# Patient Record
Sex: Female | Born: 1970
Health system: Southern US, Community
[De-identification: ages and names within clinical notes are randomized; demographics above are authoritative.]

## PROBLEM LIST (undated history)

## (undated) ENCOUNTER — Emergency Department (HOSPITAL_COMMUNITY): Admission: EM | Payer: BLUE CROSS/BLUE SHIELD | Source: Home / Self Care

## (undated) DIAGNOSIS — R51 Headache: Secondary | ICD-10-CM

## (undated) DIAGNOSIS — L959 Vasculitis limited to the skin, unspecified: Secondary | ICD-10-CM

## (undated) DIAGNOSIS — I7300 Raynaud's syndrome without gangrene: Secondary | ICD-10-CM

## (undated) DIAGNOSIS — F1190 Opioid use, unspecified, uncomplicated: Secondary | ICD-10-CM

## (undated) DIAGNOSIS — M542 Cervicalgia: Secondary | ICD-10-CM

## (undated) DIAGNOSIS — R000 Tachycardia, unspecified: Secondary | ICD-10-CM

## (undated) DIAGNOSIS — I776 Arteritis, unspecified: Secondary | ICD-10-CM

## (undated) DIAGNOSIS — IMO0002 Reserved for concepts with insufficient information to code with codable children: Secondary | ICD-10-CM

## (undated) DIAGNOSIS — G709 Myoneural disorder, unspecified: Secondary | ICD-10-CM

## (undated) DIAGNOSIS — R42 Dizziness and giddiness: Secondary | ICD-10-CM

## (undated) DIAGNOSIS — K219 Gastro-esophageal reflux disease without esophagitis: Secondary | ICD-10-CM

## (undated) DIAGNOSIS — D649 Anemia, unspecified: Secondary | ICD-10-CM

## (undated) DIAGNOSIS — R519 Headache, unspecified: Secondary | ICD-10-CM

## (undated) DIAGNOSIS — I10 Essential (primary) hypertension: Secondary | ICD-10-CM

## (undated) DIAGNOSIS — K635 Polyp of colon: Secondary | ICD-10-CM

## (undated) DIAGNOSIS — E049 Nontoxic goiter, unspecified: Secondary | ICD-10-CM

## (undated) DIAGNOSIS — M898X1 Other specified disorders of bone, shoulder: Secondary | ICD-10-CM

## (undated) DIAGNOSIS — N809 Endometriosis, unspecified: Secondary | ICD-10-CM

## (undated) DIAGNOSIS — M79602 Pain in left arm: Secondary | ICD-10-CM

## (undated) DIAGNOSIS — G4730 Sleep apnea, unspecified: Secondary | ICD-10-CM

## (undated) DIAGNOSIS — N3010 Interstitial cystitis (chronic) without hematuria: Secondary | ICD-10-CM

## (undated) DIAGNOSIS — G8929 Other chronic pain: Secondary | ICD-10-CM

## (undated) DIAGNOSIS — M25562 Pain in left knee: Secondary | ICD-10-CM

## (undated) DIAGNOSIS — L88 Pyoderma gangrenosum: Secondary | ICD-10-CM

## (undated) DIAGNOSIS — G540 Brachial plexus disorders: Secondary | ICD-10-CM

## (undated) DIAGNOSIS — M1990 Unspecified osteoarthritis, unspecified site: Secondary | ICD-10-CM

## (undated) DIAGNOSIS — M25561 Pain in right knee: Secondary | ICD-10-CM

## (undated) DIAGNOSIS — M797 Fibromyalgia: Secondary | ICD-10-CM

## (undated) HISTORY — DX: Anemia, unspecified: D64.9

## (undated) HISTORY — DX: Nontoxic goiter, unspecified: E04.9

## (undated) HISTORY — DX: Gastro-esophageal reflux disease without esophagitis: K21.9

## (undated) HISTORY — DX: Tachycardia, unspecified: R00.0

## (undated) HISTORY — PX: OTHER SURGICAL HISTORY: SHX169

## (undated) HISTORY — DX: Reserved for concepts with insufficient information to code with codable children: IMO0002

## (undated) HISTORY — DX: Sleep apnea, unspecified: G47.30

## (undated) HISTORY — DX: Polyp of colon: K63.5

## (undated) HISTORY — DX: Brachial plexus disorders: G54.0

## (undated) HISTORY — PX: ABDOMINAL HYSTERECTOMY: SHX81

## (undated) HISTORY — DX: Essential (primary) hypertension: I10

## (undated) HISTORY — DX: Endometriosis, unspecified: N80.9

## (undated) HISTORY — DX: Unspecified osteoarthritis, unspecified site: M19.90

## (undated) HISTORY — DX: Vasculitis limited to the skin, unspecified: L95.9

## (undated) HISTORY — PX: OVARIAN CYST REMOVAL: SHX89

## (undated) HISTORY — DX: Interstitial cystitis (chronic) without hematuria: N30.10

## (undated) HISTORY — DX: Opioid use, unspecified, uncomplicated: F11.90

## (undated) HISTORY — DX: Arteritis, unspecified: I77.6

## (undated) HISTORY — DX: Myoneural disorder, unspecified: G70.9

## (undated) HISTORY — DX: Fibromyalgia: M79.7

---

## 1997-06-24 ENCOUNTER — Other Ambulatory Visit: Admission: RE | Admit: 1997-06-24 | Discharge: 1997-06-24 | Payer: Self-pay | Admitting: Gynecology

## 1998-11-15 ENCOUNTER — Other Ambulatory Visit: Admission: RE | Admit: 1998-11-15 | Discharge: 1998-11-15 | Payer: Self-pay | Admitting: Gynecology

## 2000-07-26 ENCOUNTER — Ambulatory Visit (HOSPITAL_BASED_OUTPATIENT_CLINIC_OR_DEPARTMENT_OTHER): Admission: RE | Admit: 2000-07-26 | Discharge: 2000-07-26 | Payer: Self-pay | Admitting: *Deleted

## 2000-07-26 ENCOUNTER — Encounter (INDEPENDENT_AMBULATORY_CARE_PROVIDER_SITE_OTHER): Payer: Self-pay | Admitting: Specialist

## 2000-09-13 ENCOUNTER — Encounter: Admission: RE | Admit: 2000-09-13 | Discharge: 2000-09-13 | Payer: Self-pay | Admitting: *Deleted

## 2000-11-21 ENCOUNTER — Other Ambulatory Visit: Admission: RE | Admit: 2000-11-21 | Discharge: 2000-11-21 | Payer: Self-pay | Admitting: Gynecology

## 2002-01-29 DIAGNOSIS — G540 Brachial plexus disorders: Secondary | ICD-10-CM

## 2002-01-29 HISTORY — DX: Brachial plexus disorders: G54.0

## 2002-06-06 ENCOUNTER — Emergency Department (HOSPITAL_COMMUNITY): Admission: EM | Admit: 2002-06-06 | Discharge: 2002-06-06 | Payer: Self-pay | Admitting: Emergency Medicine

## 2002-06-09 ENCOUNTER — Other Ambulatory Visit: Admission: RE | Admit: 2002-06-09 | Discharge: 2002-06-09 | Payer: Self-pay | Admitting: Gynecology

## 2003-07-20 ENCOUNTER — Emergency Department (HOSPITAL_COMMUNITY): Admission: EM | Admit: 2003-07-20 | Discharge: 2003-07-21 | Payer: Self-pay | Admitting: Emergency Medicine

## 2003-07-25 ENCOUNTER — Ambulatory Visit (HOSPITAL_COMMUNITY): Admission: AD | Admit: 2003-07-25 | Discharge: 2003-07-25 | Payer: Self-pay | Admitting: Gynecology

## 2003-07-25 ENCOUNTER — Encounter (INDEPENDENT_AMBULATORY_CARE_PROVIDER_SITE_OTHER): Payer: Self-pay | Admitting: Specialist

## 2003-07-26 ENCOUNTER — Encounter (INDEPENDENT_AMBULATORY_CARE_PROVIDER_SITE_OTHER): Payer: Self-pay | Admitting: Specialist

## 2003-11-19 ENCOUNTER — Other Ambulatory Visit: Admission: RE | Admit: 2003-11-19 | Discharge: 2003-11-19 | Payer: Self-pay | Admitting: Gynecology

## 2003-12-01 ENCOUNTER — Encounter: Admission: RE | Admit: 2003-12-01 | Discharge: 2003-12-01 | Payer: Self-pay | Admitting: Gastroenterology

## 2004-04-06 ENCOUNTER — Ambulatory Visit (HOSPITAL_COMMUNITY): Admission: RE | Admit: 2004-04-06 | Discharge: 2004-04-06 | Payer: Self-pay | Admitting: Gastroenterology

## 2004-04-06 ENCOUNTER — Encounter (INDEPENDENT_AMBULATORY_CARE_PROVIDER_SITE_OTHER): Payer: Self-pay | Admitting: *Deleted

## 2004-04-30 ENCOUNTER — Emergency Department (HOSPITAL_COMMUNITY): Admission: EM | Admit: 2004-04-30 | Discharge: 2004-04-30 | Payer: Self-pay | Admitting: Emergency Medicine

## 2004-05-12 ENCOUNTER — Emergency Department (HOSPITAL_COMMUNITY): Admission: EM | Admit: 2004-05-12 | Discharge: 2004-05-12 | Payer: Self-pay | Admitting: Family Medicine

## 2004-06-28 ENCOUNTER — Encounter: Admission: RE | Admit: 2004-06-28 | Discharge: 2004-06-28 | Payer: Self-pay | Admitting: Thoracic Surgery

## 2005-01-29 HISTORY — PX: PELVIC LAPAROSCOPY: SHX162

## 2006-06-27 ENCOUNTER — Emergency Department (HOSPITAL_COMMUNITY): Admission: EM | Admit: 2006-06-27 | Discharge: 2006-06-27 | Payer: Self-pay | Admitting: Emergency Medicine

## 2006-10-24 ENCOUNTER — Encounter (INDEPENDENT_AMBULATORY_CARE_PROVIDER_SITE_OTHER): Payer: Self-pay | Admitting: Urology

## 2006-10-24 ENCOUNTER — Ambulatory Visit (HOSPITAL_BASED_OUTPATIENT_CLINIC_OR_DEPARTMENT_OTHER): Admission: RE | Admit: 2006-10-24 | Discharge: 2006-10-24 | Payer: Self-pay | Admitting: Urology

## 2007-02-24 ENCOUNTER — Ambulatory Visit: Payer: Self-pay | Admitting: Gastroenterology

## 2007-02-28 ENCOUNTER — Ambulatory Visit: Payer: Self-pay | Admitting: Cardiology

## 2007-03-03 ENCOUNTER — Ambulatory Visit: Payer: Self-pay | Admitting: Gastroenterology

## 2007-03-19 ENCOUNTER — Emergency Department (HOSPITAL_COMMUNITY): Admission: EM | Admit: 2007-03-19 | Discharge: 2007-03-20 | Payer: Self-pay | Admitting: Emergency Medicine

## 2007-03-25 ENCOUNTER — Other Ambulatory Visit: Admission: RE | Admit: 2007-03-25 | Discharge: 2007-03-25 | Payer: Self-pay | Admitting: Gynecology

## 2007-04-15 ENCOUNTER — Ambulatory Visit: Payer: Self-pay | Admitting: Gastroenterology

## 2007-08-27 ENCOUNTER — Ambulatory Visit (HOSPITAL_BASED_OUTPATIENT_CLINIC_OR_DEPARTMENT_OTHER): Admission: RE | Admit: 2007-08-27 | Discharge: 2007-08-27 | Payer: Self-pay | Admitting: Family Medicine

## 2007-08-30 ENCOUNTER — Ambulatory Visit: Payer: Self-pay | Admitting: Internal Medicine

## 2007-12-16 ENCOUNTER — Ambulatory Visit: Payer: Self-pay | Admitting: Gynecology

## 2008-09-15 ENCOUNTER — Other Ambulatory Visit: Admission: RE | Admit: 2008-09-15 | Discharge: 2008-09-15 | Payer: Self-pay | Admitting: Gynecology

## 2008-09-15 ENCOUNTER — Encounter: Payer: Self-pay | Admitting: Gynecology

## 2008-09-15 ENCOUNTER — Ambulatory Visit: Payer: Self-pay | Admitting: Gynecology

## 2009-04-07 ENCOUNTER — Emergency Department (HOSPITAL_COMMUNITY): Admission: EM | Admit: 2009-04-07 | Discharge: 2009-04-07 | Payer: Self-pay | Admitting: Emergency Medicine

## 2009-04-12 ENCOUNTER — Ambulatory Visit: Payer: Self-pay | Admitting: Gastroenterology

## 2009-04-12 ENCOUNTER — Emergency Department (HOSPITAL_COMMUNITY): Admission: EM | Admit: 2009-04-12 | Discharge: 2009-04-12 | Payer: Self-pay | Admitting: Emergency Medicine

## 2009-11-24 ENCOUNTER — Ambulatory Visit: Payer: Self-pay | Admitting: Gynecology

## 2010-02-19 ENCOUNTER — Encounter: Payer: Self-pay | Admitting: Gastroenterology

## 2010-02-28 NOTE — Assessment & Plan Note (Signed)
Review of gastrointestinal problems: 1. Nonulcer dyspepsia: EGD 2009 was normal. I suspected much of her symptoms were from the several NSAIDs that she was taking daily.  GERD may contribute    History of Present Illness Visit Type: Initial Consult Primary GI MD: Rob Bunting MD Primary Provider: Dewain Penning, MD Requesting Provider: Dewain Penning, MD Chief Complaint: Abdominal pain , fatique History of Present Illness:     who was last seen abouty 2 years ago.  She has been very fatigued lately,  drinking alot of Red Bull, coffee.   Recently found to have anemia, Hb 10.5 (low normal MCV).   she has not stop taking the NSAIDs like I recommended. She has chronic intermittent right lower quadrant pains and 4 days ago the pains became much more acute Called her PCP, gynecologist and was told to go to ER for possible appendicitis.  She was seen by a surgeon,  who was prepping her for surgery, however CT was done and suggested no appy and so she did not want to proceed even though surgeon said she still should.  she was given a prescription for Augmentin but stopped taking those pills after only 2 days. She has been having some low-grade fevers.  she has been eating and having normal bowel movements without diarrhea. She has had no overt GI bleeding.  About 2 weeks ago, ran out of nexium.  GERD symptoms became MUCH worse.  she is nearly panic since looking through the Internet last night and reading a lot about anemia and its associated with colon cancer. She is certain that she probably has a colon cancer. She did have a colonoscopy Dr. Leary Roca in 2006 and is found a small tubular adenoma and so she would be due for colonoscopy are round now.           Current Medications (verified): 1)  Nexium 40 Mg Cpdr (Esomeprazole Magnesium) .Marland Kitchen.. 1 By Mouth Once Daily 2)  Advil 200 Mg Tabs (Ibuprofen) .... As Needed 3)  Diazepam 5 Mg Tabs (Diazepam) .... Take 1/2 Tablet At Bedtime 4)  Percocet  5-325 Mg Tabs (Oxycodone-Acetaminophen) .... As Needed  Allergies (verified): 1)  ! Tetracycline 2)  ! Doxycycline  Past History:  Past Medical History: Tubular adenomas ([facility] 2006) thoracic outlet syndrome GERD  Interstitial Cystitis Ovarian cyst   Past Surgical History: Hemorrhoidectomy ovary removed   Family History: no colon cancer  Social History: does not smoke, rare alcohol  Review of Systems       Pertinent positive and negative review of systems were noted in the above HPI and GI specific review of systems.  All other review of systems was otherwise negative.   Vital Signs:  Patient profile:   40 year old female Height:      66 inches Weight:      134.38 pounds BMI:     21.77 Temp:     98.4 degrees F oral Pulse rate:   100 / minute Pulse rhythm:   regular BP sitting:   116 / 66  (left arm) Cuff size:   regular  Vitals Entered By: June McMurray CMA Duncan Dull) (April 12, 2009 1:41 PM)  Physical Exam  Additional Exam:  Constitutional: generally well appearing Psychiatric: alert and oriented times 3 Abdomen: soft, Very tender in the right lower quadrant, + rovsing sign when pressing on left abdomen non-distended, normal bowel sounds    Impression & Recommendations:  Problem # 1:  subacute right lower quadrant pain I did  review the CT scan that she had clearly her appendix looked normal however she is still very tender in the right lower quadrant. She has had intermittent right lower quadrant pains but never nearly as severe as this.  I spoke with a surgeon from [facility] [place] [facility] and we are arranging for the patient to go to [facility] long emergency room for a CBC and surgical reevaluation in the emergency room.  Patient Instructions: 1)  Go to the [facility] long emergency room for repeat surgical evaluation, repeat labs 2)  Restart your antibiotics for now. 3)  A copy of this information will be sent to Dr. Collins Scotland. 4)  Return to see Dr. Christella Hartigan in 1-2  months. 5)  The medication list was reviewed and reconciled.  All changed / newly prescribed medications were explained.  A complete medication list was provided to the patient / caregiver.

## 2010-02-28 NOTE — Procedures (Signed)
Summary: EGD   EGD  Procedure date:  03/03/2007  Findings:      [facility]   Patient Name: [name], [name] MRN:  Procedure Procedures: Panendoscopy (EGD) CPT: 43235.  Personnel: Endoscopist: Rachael Fee, MD.  Referred By: Herb Grays, MD.  Exam Location: Exam performed in Endoscopy Suite. Outpatient  Patient Consent: Procedure, Alternatives, Risks and Benefits discussed, consent obtained, from patient. Consent was obtained by the RN.  Indications Symptoms: Abdominal pain, Reflux symptoms  History  Current Medications: Patient is not currently taking Coumadin.  Comments: Patient history reviewed and updated, pre-procedure physical performed prior to initiation of sedation? yes Pre-Exam Physical: Performed Mar 03, 2007  Cardio-pulmonary exam, Abdominal exam, Mental status exam WNL.  Exam Exam Info: Maximum depth of insertion Duodenum, intended Duodenum. Patient position: on left side. Gastric retroflexion performed. Images taken. ASA Classification: II. Tolerance: good.  Sedation Meds: Patient assessed and found to be appropriate for moderate (conscious) sedation. Fentanyl 75 mcg. given IV. Versed 7 mg. given IV. benedryl 25 given IV.  Monitoring: BP and pulse monitoring done. Oximetry used. Supplemental O2 given  Findings - Normal: Proximal Esophagus to Duodenal 2nd Portion.   Assessment Normal examination.  Comments: DAILY NSAIDS (IBUPROFEN) IS LIKELY CONTRIBUTING TO NON-ULCER DYSPEPSIA.  SHE SHOULD LIMIT THOSE AS MUCH AS POSSIBLE.  SHE WILL ALSO INCREASE NEXIUM BACK TO TWICE DAILY, BEST TAKEN 20-30 MIN PRIOR TO BREAKFAST AND DINNER MEALS.  LASTLY THE CT SCAN WITH IV AND PO CONTRAST LAST WEEK WAS ESSENTIALLY NORMAL EXCEPT FOR OVARIAN CYSTS AND ? OF CONGESTION.  I WILL ARRANGE FOR PELVIC ULTRASOUND IN 6 WEEKS (AS WAS RECOMMENDED BY RADIOLOGY).  SHE WILL F/U WITH ME IN OFFICE 1-2 WEEKS AFTERWARDS. Events  Unplanned  Intervention: No unplanned interventions were required.  Unplanned Events: There were no complications. Plans Comments: PELVIC (TRANSVAGINAL) ULTRASOUND IN 5-6 WEEKS. ROV WITH ME IN 6-7 WEEKS. RESUME NEXIUM TWICE DAILY BUT TAKEN AT THE CORRECT TIME IN RELATION TO MEALS. This report was created from the original endoscopy report, which was reviewed and signed by the above listed endoscopist.

## 2010-04-19 ENCOUNTER — Ambulatory Visit (INDEPENDENT_AMBULATORY_CARE_PROVIDER_SITE_OTHER): Payer: 59 | Admitting: Gynecology

## 2010-04-19 ENCOUNTER — Other Ambulatory Visit: Payer: 59

## 2010-04-19 DIAGNOSIS — N800 Endometriosis of uterus: Secondary | ICD-10-CM

## 2010-04-19 DIAGNOSIS — N8300 Follicular cyst of ovary, unspecified side: Secondary | ICD-10-CM

## 2010-04-19 DIAGNOSIS — N920 Excessive and frequent menstruation with regular cycle: Secondary | ICD-10-CM

## 2010-04-23 LAB — POCT I-STAT, CHEM 8
BUN: 10 mg/dL (ref 6–23)
Calcium, Ion: 1.2 mmol/L (ref 1.12–1.32)
Creatinine, Ser: 0.6 mg/dL (ref 0.4–1.2)

## 2010-04-23 LAB — CBC
MCHC: 32 g/dL (ref 30.0–36.0)
MCV: 85.2 fL (ref 78.0–100.0)
MCV: 86.6 fL (ref 78.0–100.0)
Platelets: 317 10*3/uL (ref 150–400)
RBC: 4.09 MIL/uL (ref 3.87–5.11)
RBC: 4.16 MIL/uL (ref 3.87–5.11)
RDW: 15.1 % (ref 11.5–15.5)
RDW: 15.3 % (ref 11.5–15.5)
WBC: 7.4 10*3/uL (ref 4.0–10.5)
WBC: 9 10*3/uL (ref 4.0–10.5)

## 2010-04-23 LAB — PREGNANCY, URINE: Preg Test, Ur: NEGATIVE

## 2010-04-23 LAB — URINALYSIS, ROUTINE W REFLEX MICROSCOPIC
Bilirubin Urine: NEGATIVE
Bilirubin Urine: NEGATIVE
Hgb urine dipstick: NEGATIVE
Ketones, ur: NEGATIVE mg/dL
Leukocytes, UA: NEGATIVE
Nitrite: NEGATIVE

## 2010-04-23 LAB — URINE MICROSCOPIC-ADD ON

## 2010-04-23 LAB — BASIC METABOLIC PANEL
Creatinine, Ser: 0.6 mg/dL (ref 0.4–1.2)
Glucose, Bld: 95 mg/dL (ref 70–99)
Sodium: 136 mEq/L (ref 135–145)

## 2010-04-23 LAB — PROTIME-INR: INR: 0.99 (ref 0.00–1.49)

## 2010-04-23 LAB — DIFFERENTIAL
Basophils Absolute: 0 10*3/uL (ref 0.0–0.1)
Basophils Absolute: 0 10*3/uL (ref 0.0–0.1)
Basophils Relative: 0 % (ref 0–1)
Eosinophils Absolute: 0.1 10*3/uL (ref 0.0–0.7)
Eosinophils Relative: 0 % (ref 0–5)
Eosinophils Relative: 1 % (ref 0–5)
Monocytes Absolute: 0.5 10*3/uL (ref 0.1–1.0)
Neutro Abs: 7.2 10*3/uL (ref 1.7–7.7)

## 2010-06-12 ENCOUNTER — Institutional Professional Consult (permissible substitution) (INDEPENDENT_AMBULATORY_CARE_PROVIDER_SITE_OTHER): Payer: 59 | Admitting: Gynecology

## 2010-06-12 DIAGNOSIS — N949 Unspecified condition associated with female genital organs and menstrual cycle: Secondary | ICD-10-CM

## 2010-06-12 DIAGNOSIS — IMO0002 Reserved for concepts with insufficient information to code with codable children: Secondary | ICD-10-CM

## 2010-06-12 DIAGNOSIS — N920 Excessive and frequent menstruation with regular cycle: Secondary | ICD-10-CM

## 2010-06-13 NOTE — Op Note (Signed)
NAME:  [name], [name]              ACCOUNT NO.:  [id number]   MEDICAL RECORD NO.:  [id number]          PATIENT TYPE:  AMB   LOCATION:  [facility]                         FACILITY:  [facility]   PHYSICIAN:  Sigmund I. Patsi Sears, M.D.DATE OF BIRTH:  1970-08-31   DATE OF PROCEDURE:  10/24/2006  DATE OF DISCHARGE:                               OPERATIVE REPORT   PREOPERATIVE DIAGNOSIS:  Interstitial cystitis.   POSTOPERATIVE DIAGNOSIS:  Interstitial cystitis.   OPERATION:  1. Cystourethroscopy.  2. Hydrodistention of bladder (750 mL).  3. Cold cup bladder biopsy.  4. Cauterization of biopsy sites.  5. Instillation of Pyridium and Marcaine in the bladder.  6. Injection of Marcaine and Kenalog in the subtrigonal space.   ANESTHESIA:  General mask (history of TMJ), as well as IV Toradol, and  B&O suppository.   PREPARATION:  Appropriate preanesthesia, the patient was brought to the  operating room, placed on the operating table in the dorsal supine  position where general LMA anesthesia was introduced.  She was then  placed in the dorsal lithotomy position when the pubis was prepped with  Betadine solution and draped in usual fashion.   HISTORY:  This 40 year old female has a history of urinary frequency,  overactive bladder symptoms, pelvic pain, particularly in the right  side, dyspareunia, and extremely painful bladder syndrome.  After  emptying the bladder, the patient feels a dull ache in the right side of  her abdomen.  Her O'Leary symptom score sheet is 21.  She is now for  cystoscopy, HOD, bladder biopsy.   PROCEDURE:  Cystoscopy was accomplished, and shows a normal-appearing  urethra and normal bladder base.  There is no evidence of bladder stone,  tumor or bladder diverticular formation.  The bladder holds 750 mL.  The  bladder is hydrodistended.  Following this, bladder biopsies were  obtained, and biopsy sites are cauterized.  Multiple small areas of  ulcerative patches are  identified within the bladder.   Marcaine and Pyridium solution is inserted in the bladder.  Marcaine and  Kenalog solution was injected to the subtrigonal space.  The patient  tolerated the procedure well.  A B&O suppository was given at anesthesia  induction, and IV Toradol was given at the end of procedure.  The  patient was awakened and taken to the recovery room in good condition.     Sigmund I. Patsi Sears, M.D.  Electronically Signed    SIT/MEDQ  D:  10/24/2006  T:  10/24/2006  Job:  [id number]

## 2010-06-13 NOTE — Assessment & Plan Note (Signed)
[name]                         GASTROENTEROLOGY OFFICE NOTE   NAME:Anne, Ward                     MRN:          [id number]  DATE:02/24/2007                            DOB:          [birth year]    This is a patient of Dr. Herb Grays.  Dr. Collins Ward referred [name] to  me in consultation regarding chronic abdominal discomfort, constipation,  GERD.   HISTORY OF PRESENT ILLNESS:  [name] is a very pleasant 40 year old  woman who has had several years of intermittent epigastric discomfort,  constipation off and on, GERD, and issues with reflux.  First, she takes  Nexium twice daily, 1 pill before her a.m. coffee and 1 pill before bed.  She says if she misses any, she gets heartburn and a raspy voice.  She  had an EGD 10-15 years ago.  She was told she had an ulcer in her  esophageus that was possibly from tetracycline.   She has to strain a lot and has constipation, sometimes moves her bowels  only every 2 to 3 days.  She takes MiraLax on an as needed basis, maybe  once to twice a week.  She says around the time of her menstrual cycle,  her bowel habits are even more altered.  She has not seen frank blood in  her stool.  She did have a colonoscopy by Dr. Leary Roca in March of  2006.  He describes internal and external hemorrhoids, small descending  colon polyp that proved to be a tubular adenoma.   She has had abdominal discomfort off and on sometimes improved with  bowel movements.  She has had blood testing done to work this up showing  a normal CBC, and a normal complete metabolic profile.  She also had an  abdominal ultrasound done December 2008 showing normal pancreas, normal  gallbladder, normal bile duct.  Otherwise, was normal.   REVIEW OF SYSTEMS:  Notable for a 12-pound weight gain in the past 2  months and otherwise essentially normal, and is available on our nursing  intake sheet.   PAST MEDICAL HISTORY:  Recently diagnosed  with interstitial cystitis  status post hemorrhoid surgery 8 years ago.  Right ovarian cyst.  Right  tube and ovary removed in 2007.  Personal history of tubular adenomas in  the colon.  GERD.  Thoracic outlet syndrome causing chronic pains.   CURRENT MEDICATIONS:  1. Nexium twice daily.  2. Diazepam.  3. Ibuprofen she takes 3 times daily every day.  4. Elmiron.  5. Yaz.  6. OxyContin she takes for her history of thoracic outlet syndrome and      pains from that.   ALLERGIES:  TETRACYCLINE and DOXYCYCLINE.   SOCIAL HISTORY:  Married with 2 daughters.  Works as Emergency planning/management officer.  Nonsmoker.  Drinks wine once or twice a week.  Significant that her  mother passed away last week.   FAMILY HISTORY:  Grandmother with colon polyps.  Mother with rare form  of ocular melanoma.   PHYSICAL EXAM:  Height 5 feet 6 inches, weight 130 pounds.  Blood  pressure 140/90,  pulse 88.  CONSTITUTIONAL:  Generally well-appearing.  NEUROLOGIC:  Alert and oriented x3.  EYES:  Extraocular muscles are intact.  MOUTH:  Oropharynx moist with no lesions.  NECK:  Supple.  No lymphadenopathy.  CARDIOVASCULAR:  Regular rate and rhythm.  LUNGS:  Clear to auscultation bilaterally.  ABDOMEN:  Soft and nontender, nondistended.  Normal bowel sounds.  EXTREMITIES:  No lower extremity edema.  SKIN:  No rashes or lesions in the visible extremities.   ASSESSMENT AND PLAN:  A 40 year old woman with chronic GERD,  intermittent abdominal discomfort, intermittent constipation.   First, she takes Advil/ibuprofen 3 times a day and is having  intermittent epigastric discomfort.  Has a chronic history of GERD.  I  think, for these 2 reasons, we should proceed with endoscopy at her  soonest convenience.  She has chronic intermittent constipation  alternating with some loose stools and also some abdominal discomfort,  and for that I have recommended that she change her MiraLax to taking it  on a daily basis rather than just  as needed.  Also, adjusting her Nexium  so that she takes it once daily, but 20-30 minutes prior to her dinner  meal, as that is the most reliable meal a day for her, and that is the  way the pill is designed to work most effectively (20-30 minutes prior  to a meal).  Lastly, for her intermittent abdominal discomfort, I will  get a CT scan with IV and oral contrast.     Anne Fee, MD  Electronically Signed    DPJ/MedQ  DD: 02/24/2007  DT: 02/24/2007  Job #: [id number]   cc:   [name], M.D.

## 2010-06-13 NOTE — Procedures (Signed)
NAME:  [name], [name]              ACCOUNT NO.:  [id number]   MEDICAL RECORD NO.:  [id number]          PATIENT TYPE:  OUT   LOCATION:  SLEEP CENTER                 FACILITY:  [facility]   PHYSICIAN:  Clinton D. Maple Hudson, MD, FCCP, FACPDATE OF BIRTH:  08-19-1968   DATE OF STUDY:  08/27/2007                            NOCTURNAL POLYSOMNOGRAM   REFERRING PHYSICIAN:  Tammy R. Collins Scotland, M.D.   INDICATION FOR STUDY:  Hypersomnia with sleep apnea.   EPWORTH SLEEPINESS SCORE:  17/24.   BMI 21.  Weight 132 pounds, height 66 inches, neck 12 inches.   MEDICATIONS:  Home medication charted and reviewed.   SLEEP ARCHITECTURE:  Total sleep time 372 minutes with sleep efficiency  92.1%.  Stage 1 was 6.2%, stage 2 78.5%, stage 3 absent, REM 15.3% of  total sleep time.  Sleep latency 4 minutes, REM latency 69 minutes.  Awake after sleep onset 28 minutes.  Arousal index 33.1, indicating  increased EEG arousal.  One-quarter of a Valium tablet was taken at  bedtime.   RESPIRATORY DATA:  Apnea/hypopnea index (AHI) 2.4 per hour, respiratory  disturbance index (RDI) 10.2 per hour.  A total of 15 events was scored,  including 11 obstructive apneas, 2 central apneas and 2 hypopneas.  All  events were associated with supine sleep position. REM AHI 12.6 per  hour.  There were insufficient events to permit CPAP titration by split  protocol on this study night.   OXYGEN DATA:  Mild snoring with oxygen desaturation to a nadir of 92%.   CARDIAC DATA:  Normal sinus rhythm.   MOVEMENT/PARASOMNIA:  No significant movement disturbance.  Bathroom  times one.   IMPRESSIONS/RECOMMENDATIONS:  1. Unremarkable sleep architecture for sleep center environment.  2. Occasional respiratory events, causing sleep disturbance, within      normal limits, AHI 2.4 per hour, RDI 10.2 per hour.  Events were      positional and might be prevented by encouraging sleep off flat of      back.  Mild snoring with oxygen desaturation to a  nadir of 92%.  3. She had given report of occasional sleep-related panic or fearful      episodes, including dreaming that she was      screaming.  Such events may reflect incomplete sleep stage      transitions or partial arousals.  No associated abnormality was      identified during this study night.      Clinton D. Maple Hudson, MD, FCCP, FACP  Diplomate, Biomedical engineer of Sleep Medicine  Electronically Signed     CDY/MEDQ  D:  08/30/2007 11:19:41  T:  08/30/2007 11:42:25  Job:  [id number]

## 2010-06-13 NOTE — Procedures (Signed)
NAME:  [name], [name]              ACCOUNT NO.:  [id number]   MEDICAL RECORD NO.:  [id number]          PATIENT TYPE:  OUT   LOCATION:  SLEEP CENTER                 [facility]ACILITY[facility]   PHYSICIAN:  Clinton D. Maple Hudson, MD, FCCP, FACPDATE OF BIRTH:  03/29/1968   DATE OF STUDY:  08/27/2007                            NOCTURNAL POLYSOMNOGRAM   REFERRING PHYSICIAN:   REFERRING PHYSICIAN:  Tammy R. Collins Scotland, M.D.   INDICATION FOR STUDY:  Hypersomnia with sleep apnea.   EPWORTH SLEEPINESS SCORE:  17/24.  BMI 21.  Weight 132 pounds.  Height  66 inches.  Neck 12 inches.   HOME MEDICATIONS:  Charted and reviewed.   SLEEP ARCHITECTURE:  Total sleep time 372 minutes with sleep efficiency  92.1%.  Stage I was 6.2%.  Stage II 78.5%.  Stage III absent.  REM 15.3%  of total sleep time.  Sleep latency 4 minutes.  REM latency 69 minutes.  Awake after sleep onset 28 minutes.  Arousal index 33.1, indicating  increased EEG arousal.  Bedtime medication included 1/4 of a Valium  tablet.   RESPIRATORY DATA:  Apnea-hypopnea index (AHI) 2.4 per hour.  A total of  15 events were scored, including 11 obstructive apneas, 2 central  apneas, and 2 hypopneas.  All events were recorded while supine.  REM  AHI 12.6.  Respiratory Disturbance Index (RDI) 10.2 with RERA count 48  and RERA index 7.7.  There were insufficient events to permit CPAP  titration by split protocol on this study night.   OXYGEN DATA:  Mild snoring with oxygen desaturation to a nadir of 92%.  Mean oxygen saturation through the study was 97.8% on room air.   CARDIAC DATA:  DICTATION ENDED AT THIS POINT.   MOVEMENT-PARASOMNIA:   IMPRESSIONS-RECOMMENDATIONS:     Clinton D. Maple Hudson, MD, [facility], FACP  Diplomate, Biomedical engineer of Sleep Medicine  Electronically Signed    CDY/MEDQ  D:  08/30/2007 11:05:30  T:  08/30/2007 11:15:54  Job:  [id number]

## 2010-06-16 ENCOUNTER — Other Ambulatory Visit (HOSPITAL_COMMUNITY): Payer: 59

## 2010-06-16 NOTE — Op Note (Signed)
NAME:  [name], [name]                        ACCOUNT NO.:  [id number]   MEDICAL RECORD NO.:  [id number]                   PATIENT TYPE:  MAT   LOCATION:  [facility]                                 FACILITY:  [facility]   PHYSICIAN:  Juan H. Lily Peer, M.D.             DATE OF BIRTH:  Dec 02, 1970   DATE OF PROCEDURE:  07/25/2003  DATE OF DISCHARGE:                                 OPERATIVE REPORT   PREOPERATIVE DIAGNOSES:  1. Chronic right lower quadrant pain.  2. Right ovarian cyst.  3. Rule out torsion.   POSTOPERATIVE DIAGNOSIS:  Right ovarian cyst.   ANESTHESIA:  General endotracheal anesthesia.   SURGEON:  Juan H. Lily Peer, M.D.   PROCEDURES PERFORMED:  1. Diagnostic laparoscopy.  2. Right ovarian laparoscopic cystectomy.  3. Pelvic washings.   INDICATION FOR OPERATION:  The patient is a 40 year old gravida 3, para 2,  AB 1, with known history of right ovarian cyst diagnosed this past week,  complaining of worsening pain.  Ultrasound demonstrated a 6 cm right ovarian  cyst.  Rule out torsion.   FINDINGS:  A 6 cm smooth-appearing right ovarian cyst, mobile, possibly  intermittently torsing causing patient's pain.  Lush right fallopian tube.  Lush left fallopian tube.  Normal-appearing left ovary.  Smooth anterior and  posterior cul-de-sac.  Normal appearance of the appendix on gross inspection  as well as the surface of the gallbladder and the liver.   DESCRIPTION OF OPERATION:  After the patient was adequately counseled, she  was taken to the operating room, where she underwent a successful general  endotracheal anesthesia.  She had received a gram of Cefotan  prophylactically, a Foley catheter was inserted in an effort to monitor  urinary output.  After the drapes were in place, a small semilunar incision  was made underneath the umbilicus and with a Kelly clamp, the meticulous  dissection to the area of the fascia.  The fascia was tented and a small  nick was made and the  peritoneum was entered, and under direct visualization  the 10 mm trocar was inserted.  The trocar was removed.  The sleeve was left  in place.  Carbon dioxide 3.5 Ward were insufflated to the peritoneal cavity.  The laparoscope had been inserted and under laparoscopic guidance, two  additional ports were made approximately four fingerbreadths from the  midline, whereby two 5 mm trocars were inserted.  In systematic fashion  inspection of the pelvis demonstrated a 6 cm smooth-appearing right ovarian  cyst with no excrescences, slightly twisted, probably contributing to the  patient's underlying pain.  The right tube with lush fimbriated end.  Contralateral ovary was normal with no evidence of endometriosis, lush  fimbriated end.  Anterior and posterior cul-de-sac were without any  adhesions or endometriosis.  The appendix appears smooth on gross  inspection, as was the liver surface and the gallbladder.  The pelvic cavity  was irrigated and fluid  was submitted for cytological evaluation.  At this  point through one of the 5 mm ports, a needle was inserted and the cyst was  aspirated and the cyst collapsed.  The fluid was all submitted for  cytological evaluation.  With the two 5 mm trocars filleting open the ovary  and cyst, the cyst wall was able to be removed and submitted for  pathological evaluation.  The inside capsule was cauterized and then  Interceed was placed to prevent adhesions in the future.  This was done  after the pelvic cavity was copiously irrigated and ascertained hemostasis.  The instruments were removed, as was the carbon dioxide.  Pre- and post-  procedures were obtained.  The small fascial incision was closed with a  figure-of-eight of 0 Vicryl suture and the skin was reapproximated with a  subcuticular stitch of 4-0 plain catgut suture.  The two 5 mm port sites  were closed with single stitch of 4-0 plain catgut suture, 0.25% Marcaine  for postoperative analgesia.  The  Hulka tenaculum was removed.  The cervix  was inspected.  It appears that the patient has started part of her period  but the tenaculum site was with good hemostasis.  The patient was extubated,  transferred to recovery room with stable vital signs.  Blood loss for the  procedure was recorded as minimal.  IV fluids 2100 mL of lactated Ringer's.  Urine output 200 mL and clear.  She did receive a gram of Cefotan  prophylactically.  She will be discharged home on Lortab 7.5/500 mg one p.o.  q.4-6h. p.r.n. pain.  I have instructed her to start Provera 10 mg one p.o.  daily for the next 10 days.                                               Juan H. Lily Peer, M.D.    JHF/MEDQ  D:  07/25/2003  T:  07/26/2003  Job:  [id number]

## 2010-06-16 NOTE — H&P (Signed)
NAME:  [name], [name]                        ACCOUNT NO.:  [id number]   MEDICAL RECORD NO.:  [id number]                   PATIENT TYPE:  MAT   LOCATION:  MATC                                 [facility]ACILITY:  [facility]   PHYSICIAN:  Juan H. Lily Peer, M.D.             DATE OF BIRTH:  [birth year]   DATE OF ADMISSION:  07/25/2003  DATE OF DISCHARGE:                                HISTORY & PHYSICAL   CHIEF COMPLAINT:  1. Chronic right lower quadrant pain.  2. History of right ovarian cyst.   HISTORY OF PRESENT ILLNESS:  The patient is a 40 year old gravida 3, para 2,  AB 1 (two normal spontaneous vaginal deliveries).  The patient with a last  menstrual period of Jun 03, 2003; was seen in the office at [facility] last week and subsequently that evening in the emergency room  secondary to right lower quadrant pain.  The patient was told that she had a  tennis ball-sized right ovarian cyst.  She had a negative pregnancy test and  was to have an ultrasound for follow-up to see if the cyst had regressed on  its own.  The patient continued to have pain, and started bleeding last  night.  An ultrasound was done at [facility] today, which demonstrated  a 5.9 cm right cyst with a small septation; no blood flow to the cyst  itself.  There was no fluid in the cul-de-sac.  Contralateral view was  normal and no other abnormality was noted.   PAST MEDICAL HISTORY:  1. Hemorrhoids.  2. Constipation at times.  3. History of irritable bowel syndrome in the past.   SOCIAL HISTORY:  The patient denies smoking.  Occasional alcohol  consumption.   ALLERGIES:  TETRACYCLINE, DOXYCYCLINE.   FAMILY HISTORY:  Negative for hypertension, but mother and grandparents with  history of diabetes.   PHYSICAL EXAMINATION:  GENERAL:  Thin white female with right lower quadrant  abdominal pain, in acute distress.  VITAL SIGNS:  Temperature 98.6, pulse 71, respirations 20, blood pressure  115/65.  HEENT:   Unremarkable.  NECK:  Supple.  Trachea midline.  No carotid bruits and no thyromegaly.  LUNGS:  Clear to auscultation without rhonchi or wheezes.  HEART:  Regular rate and rhythm; no murmurs, rubs or gallops.  BREAST:  Examination is not done.  ABDOMEN:  Soft and tender -- right greater than left.  PELVIC:  Examination was attempted, but she had significant amount of  guarding in the right lower quadrant and could not really discern the size  of the cyst.  RECTAL:  Deferred.   LABORATORY DATA:  White blood cell count 4.7, hemoglobin 13.0, hematocrit  38.7, platelet count 262,00.  Urinalysis was negative.  Comprehensive  metabolic panel also was normal as well.   A transvaginal ultrasound had demonstrated a 5.9 cm septated cyst of the  right ovary, and no fluid in the cul-de-sac.  Her  quantitative beta HCG was  less than 3.   ASSESSMENT:  A 40 year old gravida 3, para 2, AB 1 with a persistent right  adnexal mass; possible torsion causing acute abdomen and right lower  quadrant pain.  The cyst measure 5.9 cm septation cyst on the right ovary,  with no fluid in the cul-de-sac.   PLAN:  The patient was counseled.  She will be taken to the operating room  to undergo a laparoscopic cystectomy.  She was also counseled of the  possibility of right salpingo oophorectomy.  Also, in the event of technical  difficulty or trauma to internal organs, she could have an open laparotomy -  - this could require then for her to have additional hospitalization days.   The risks of the operation, including infection (although she received  prophylaxis antibiotic); in the event of hemorrhage and she would need a  blood transfusion or blood products, she is fully aware of the potential  risks such as anaphylactic reaction, Hepatitis and AIDS; also, the risks of  trauma to internal organs from the surgery requiring reparative surgery at  that point.   All of these issues were discussed and all questions  were answered.  We will  follow accordingly.   The patient is scheduled for an emergency laparoscopic cystectomy with  possible right salpingo-oophorectomy and possible laparotomy.                                               Juan H. Lily Peer, M.D.    JHF/MEDQ  D:  07/25/2003  T:  07/25/2003  Job:  [id number]

## 2010-06-16 NOTE — Op Note (Signed)
NAME:  [name], SCHEPP              ACCOUNT NO.:  [id number]   MEDICAL RECORD NO.:  [id number]          PATIENT TYPE:  AMB   LOCATION:  ENDO                         FACILITY:  [facility]   PHYSICIAN:  Petra Kuba, M.D.    DATE OF BIRTH:  [birth year]   DATE OF PROCEDURE:  DATE OF DISCHARGE:                                 OPERATIVE REPORT   PROCEDURE:  Colonoscopy with polypectomy.   INDICATION:  Right lower quadrant abdominal pain of questionable etiology.  Consent was signed after risks, benefits, methods, and options were  thoroughly discussed in the office.   MEDICINES USED:  Demerol 80, Versed 10.   PROCEDURE:  Rectal inspection notable for external hemorrhoids.  Small  digital exam was negative.  Video pediatric adjustable colonoscope was  inserted and, with abdominal pressure, easily able to advance around the  colon to the cecum.  Upon insertion, a mid descending small pedunculated  polyp was seen.  No other abnormalities were seen.  The cecum was identified  by the appendiceal orifice and ileocecal valve.  In fact, the scope was  inserted a short ways into the terminal ileum, which was normal.  Photo  documentation was obtained.  The scope was slowly withdrawn.  The prep was  adequate.  There was some liquid stool that required washing and suctioning.  On slow withdraw through the colon, no abnormalities were seen as we slowly  withdrew back to the polyp seen on insertion which was found, snared.  Unfortunately, the snare cut through the polyp before we could apply  electrocautery.  IT was suctioned through the scope and collected in the  trap.  Two hot biopsies of the veins were done to remove any residual  polypoid tissue.  All pathology was put in the same container.  There was a  nice white coagulant without any signs of bleeding or residual polyp when we  were through.  The scope was further withdrawn.  No additional findings were  seen as we slowly withdrew back to the  rectum.  Anorectal pull-through in  retroflexion confirmed some small hemorrhoids.  Scope was straightened,  readvanced a short ways up the left side of the colon.  Air was suctioned  and scope removed.  The patient tolerated the procedure well.  There was no  obvious immediate complication.   ENDOSCOPIC DIAGNOSES:  1.  Internal and external small hemorrhoids.  2.  Small descending polyp snared and hot biopsied.  3.  Otherwise within normal limits to the terminal ileum.   PLAN:  Trial of antispasmodics.  Await pathology to determine future colonic  screening.  Otherwise, follow up in 1-2 months to recheck symptoms and  decide any other workup and plan.      MEM/MEDQ  D:  04/06/2004  T:  04/06/2004  Job:  [id number]   cc:   Estell Harpin, M.D.  [address]  [place] [address]  Fax: [phone]   Nadyne Coombes. Fontaine, M.D.  Fax: [phone]   Sigmund I. Patsi Sears, M.D.  [address]  [place]  [place] [address]  Fax: [phone]

## 2010-06-16 NOTE — Op Note (Signed)
[facility]  Patient:    [name], [name]                     MRN: [id number] Proc. Date: 07/26/00 Adm. Date:  [id number] Attending:  Vikki Ports.                           Operative Report  PREOPERATIVE DIAGNOSIS:  Internal grade 2 hemorrhoids.  POSTOPERATIVE DIAGNOSIS:  Internal grade 2 hemorrhoids.  OPERATION PERFORMED:  Three quadrant complex hemorrhoidectomy.  SURGEON:  Stephenie Acres, M.D.  ANESTHESIA:  General.  DESCRIPTION OF PROCEDURE:  The patient was taken to the operating room and placed in supine position.  After adequate anesthesia was induced using endotracheal tube, the patient was placed in the prone jack knife position. The perianal and rectal prep were undertaken using Betadine solution and the buttocks were taped apart. The Hill-Ferguson retractor was placed within the rectum and the right posterior hemorrhoidal bundle was identified, was quite large, was injected using Marcaine with Wydase and massaged.  Perianal skin was grasped with a triangular clamp and the lateral borders of the hemorrhoidal bundle were incised down to the apex.  All hemorrhoidal tissue was then removed off the muscle down to the apex.  The apex was clamped with a Bowie clamp and the hemorrhoid removed.  The defect was closed in a mucosa to muscle to mucosa fashion using running 2-0 locking chromic suture. This was accomplished up to the anoderm.  It was ran back down to the apex and tied off.  The anoderm was then closed with a running 3-0 chromic suture. Identical procedures were then performed on the right anterior and left lateral hemorrhoidal bundles.  All tissues were then injected using Marcaine. Gelfoam packing was placed within the rectum.  Sterile dressing was applied. The patient tolerated the procedure well and went to PACU in good condition. DD:  07/26/00 TD:  07/26/00 Job: [id number]

## 2010-06-19 ENCOUNTER — Other Ambulatory Visit: Payer: Self-pay | Admitting: Gynecology

## 2010-06-19 ENCOUNTER — Encounter (HOSPITAL_COMMUNITY): Payer: 59

## 2010-06-19 LAB — CBC
HCT: 31.7 % — ABNORMAL LOW (ref 36.0–46.0)
MCHC: 30.3 g/dL (ref 30.0–36.0)
MCV: 75.7 fL — ABNORMAL LOW (ref 78.0–100.0)
Platelets: 327 10*3/uL (ref 150–400)

## 2010-06-19 LAB — SURGICAL PCR SCREEN: MRSA, PCR: NEGATIVE

## 2010-06-22 ENCOUNTER — Other Ambulatory Visit: Payer: Self-pay | Admitting: Gynecology

## 2010-06-22 ENCOUNTER — Ambulatory Visit (HOSPITAL_COMMUNITY)
Admission: RE | Admit: 2010-06-22 | Discharge: 2010-06-23 | Disposition: A | Discharge status: Home / Self Care | Payer: 59 | Source: Ambulatory Visit | Attending: Gynecology | Admitting: Gynecology

## 2010-06-22 DIAGNOSIS — N920 Excessive and frequent menstruation with regular cycle: Secondary | ICD-10-CM

## 2010-06-22 DIAGNOSIS — D250 Submucous leiomyoma of uterus: Secondary | ICD-10-CM | POA: Insufficient documentation

## 2010-06-22 DIAGNOSIS — Z01812 Encounter for preprocedural laboratory examination: Secondary | ICD-10-CM | POA: Insufficient documentation

## 2010-06-22 DIAGNOSIS — N8000 Endometriosis of the uterus, unspecified: Secondary | ICD-10-CM | POA: Insufficient documentation

## 2010-06-22 DIAGNOSIS — IMO0002 Reserved for concepts with insufficient information to code with codable children: Secondary | ICD-10-CM | POA: Insufficient documentation

## 2010-06-22 DIAGNOSIS — N946 Dysmenorrhea, unspecified: Secondary | ICD-10-CM | POA: Insufficient documentation

## 2010-06-22 DIAGNOSIS — N949 Unspecified condition associated with female genital organs and menstrual cycle: Secondary | ICD-10-CM

## 2010-06-22 DIAGNOSIS — Z01818 Encounter for other preprocedural examination: Secondary | ICD-10-CM | POA: Insufficient documentation

## 2010-06-23 LAB — CBC
HCT: 27.5 % — ABNORMAL LOW (ref 36.0–46.0)
Hemoglobin: 8.4 g/dL — ABNORMAL LOW (ref 12.0–15.0)
MCH: 23.1 pg — ABNORMAL LOW (ref 26.0–34.0)
MCHC: 30.5 g/dL (ref 30.0–36.0)
RDW: 15 % (ref 11.5–15.5)

## 2010-06-27 NOTE — Discharge Summary (Signed)
  NAME:  [name], [name]              ACCOUNT NO.:  [id number]  MEDICAL RECORD NO.:  [id number]           PATIENT TYPE:  O  LOCATION:  9309                          [facility]ACILITY:  [facility]  PHYSICIAN:  Tiaja Hagan P. Mehran Guderian, M.D.DATE OF BIRTH:  May 24, 1970  DATE OF ADMISSION:  06/22/2010 DATE OF DISCHARGE:  06/23/2010                              DISCHARGE SUMMARY   DISCHARGE DIAGNOSES: 1. Menorrhagia. 2. Pelvic pain. 3. Dysmenorrhea. 4. Dyspareunia.  SURGERY:  Total laparoscopic hysterectomy, Jun 22, 2010  PATHOLOGY:[id number]  Leiomyomata, adenomyosis, benign secretory endometrium, benign cervix  HOSPITAL COURSE:  This 40 year old underwent uncomplicated TLH Jun 22, 2010.  Findings at time of surgery was a normal-appearing pelvis.  The patient's postop course was uncomplicated and she was discharged on postoperative day #1, ambulating well, tolerating a regular diet, voiding without difficulty.  The patient's preoperative hemoglobin was 9.6, postoperative hemoglobin was 8.4.  The patient received precautions, instructions and follow-up.  Will be seen in the office 2 weeks following discharge.  Was given a prescription for Tylox #25 1-2 p.o. q.6 h p.r.n. pain.     Rehman Levinson P. Audie Box, M.D.     TPF/MEDQ  D:  06/23/2010  T:  06/23/2010  Job:  [id number]  Electronically Signed by Colin Broach M.D. on 06/27/2010 03:48:36 PM

## 2010-06-27 NOTE — Op Note (Signed)
NAME:  [name], [name]              ACCOUNT NO.:  [id number]  MEDICAL RECORD NO.:  [id number]           PATIENT TYPE:  O  LOCATION:  9309                          [facility]ILITY[facility]  PHYSICIAN:  Isreal Moline P. Dymir Neeson, M.D.DATE OF BIRTH:  Dec 17, 1970  DATE OF PROCEDURE:  06/22/2010 DATE OF DISCHARGE:                              OPERATIVE REPORT   PREOPERATIVE DIAGNOSES:  Pelvic pain, menorrhagia, dysmenorrhea, dyspareunia.  POSTOPERATIVE DIAGNOSES:  Pelvic pain, menorrhagia, dysmenorrhea, dyspareunia.  PROCEDURE:  Total laparoscopic hysterectomy.  SURGEON:  Frida Wahlstrom P. Arik Husmann, MD  ASSISTANT:  Gaetano Hawthorne. Lily Peer, MD  ANESTHETIC:  General.  ESTIMATED BLOOD LOSS:  100 mL.  COMPLICATIONS:  None.  SPECIMEN:  Uterus.  FINDINGS:  EUA, external BUS vagina normal.  Cervix normal.  Bimanual uterus normal size, midline mobile.  Adnexa without masses. Laparoscopic anterior cul-de-sac normal.  Posterior cul-de-sac normal. Uterus normal size, shape and contour.  No serosal abnormalities noted. Right adnexa surgically absent.  Left fallopian tube and ovary grossly normal, free and mobile.  No evidence of pelvic adhesive disease or pelvic endometriosis.  Upper abdominal exam, appendix grossly normal, free mobile.  Liver smooth, no abnormalities.  Gallbladder grossly normal.  No overt upper abdominal pathology noted.  PROCEDURE:  The patient was taken to the operating room, underwent general anesthesia, placed in the low dorsal lithotomy position, received an abdominal perineal vaginal preparation with Betadine solution.  Indwelling Foley catheter placed in sterile technique.  EUA performed and the cervix was visualized with the speculum.  Anterior lip grasped with a single-tooth tenaculum, gently dilated and sounded, and a RUMI uterine manipulator and KOH cup were placed after appropriate measurements taken.  The speculum and tenaculum were removed.  The patient draped in the usual  fashion.  A repeat transverse infraumbilical incision was made using the 10-mm OptiVu direct entry trocar, the abdomen was directly entered under direct visualization without difficulty.  The abdomen was insufflated.  Right and left 5-mm suprapubic ports were then placed under direct visualization after transillumination for the vessels without difficulty.  Examination of pelvic organs, upper abdominal exam was then carried out with findings noted above.  Using the Harmonic scalpel, the left uterine ovarian pedicle was transected as was the left broad ligament at the level of the round ligament.  The right broad ligament was then transected using the Harmonic scalpel again to the level of the round ligament.  Both right and left round ligaments were then transected again with the Harmonic scalpel and the anterior vesicouterine peritoneal fold was developed sharply and bluntly without difficulty.  The right uterine artery and vascular pedicle was identified, skeletonized, bipolar cauterized to a flow of 0 and then transected using the Harmonic scalpel.  A similar procedure was carried out on the other side.  The KOH cup was palpated through the vagina and using the Harmonic scalpel, the vagina was transected to the KOH cup.  The uterus was then circumferentially excised from the patient using the Harmonic scalpel against the elevated KOH cup.  The uterus was then extracted from the patient and a bulb was then placed in the vagina to maintain  the pneumoperitoneum.  The right suprapubic 5-mm port was then replaced with a 10-mm port and using the Endo Stitch 0 Vicryl applier, right and left vaginal angle sutures were placed in a figure-of-eight stitch.  The vagina was then closed anterior-posterior again using 0 Vicryl suture in figure-of-eight stitch without difficulty.  Several small bleeding points along the anterior cul-de-sac region were bipolar cauterized superficially to achieve  ultimate hemostasis.  The pressure was allowed to decrease intra-abdominally in the pelvis as well as the left ovarian pedicle were reinspected under low pressure situation showing adequate hemostasis.  The pelvis was copiously irrigated.  Irrigant was removed. Suprapubic ports were removed.  The gas again allowed to escape slowly and hemostasis was visualized both in the pelvis, the ovary and the lower abdominal port sites.  The infraumbilical port was then backed out under direct visualization showing adequate hemostasis and no evidence of hernia formation.  All skin incisions were injected using 0.25% Marcaine, 0 Vicryl interrupted subcutaneous fascial stitches were placed infraumbilically and in the right suprapubic 10-mm port and all skin incisions closed using 4-0 plain suture in interrupted cuticular stitch. The bulb syringe was removed from the patient.  The bimanual assured vaginal reapproximation, free flowing clear yellow urine was noted in the Foley catheter.  The patient was awakened without difficulty, taken to recovery room in good condition having tolerated the procedure well.     [name], M.D.TPF/MEDQ  D:  06/22/2010  T:  06/23/2010  Job:  [id number]  Electronically Signed by [name] M.D. on 06/27/2010 10:56:36 AM

## 2010-07-10 ENCOUNTER — Ambulatory Visit (INDEPENDENT_AMBULATORY_CARE_PROVIDER_SITE_OTHER): Payer: 59 | Admitting: Obstetrics and Gynecology

## 2010-07-10 DIAGNOSIS — R1904 Left lower quadrant abdominal swelling, mass and lump: Secondary | ICD-10-CM

## 2010-07-10 DIAGNOSIS — R82998 Other abnormal findings in urine: Secondary | ICD-10-CM

## 2010-08-16 ENCOUNTER — Ambulatory Visit: Payer: 59 | Admitting: Gynecology

## 2010-08-18 ENCOUNTER — Ambulatory Visit: Payer: 59 | Admitting: Gynecology

## 2010-09-18 ENCOUNTER — Emergency Department (HOSPITAL_COMMUNITY)
Admission: EM | Admit: 2010-09-18 | Discharge: 2010-09-18 | Disposition: A | Discharge status: Home / Self Care | Payer: 59 | Attending: Emergency Medicine | Admitting: Emergency Medicine

## 2010-09-18 ENCOUNTER — Emergency Department (HOSPITAL_COMMUNITY): Payer: 59

## 2010-09-18 DIAGNOSIS — M129 Arthropathy, unspecified: Secondary | ICD-10-CM | POA: Insufficient documentation

## 2010-09-18 DIAGNOSIS — R079 Chest pain, unspecified: Secondary | ICD-10-CM | POA: Insufficient documentation

## 2010-09-18 DIAGNOSIS — IMO0001 Reserved for inherently not codable concepts without codable children: Secondary | ICD-10-CM | POA: Insufficient documentation

## 2010-09-18 DIAGNOSIS — R002 Palpitations: Secondary | ICD-10-CM | POA: Insufficient documentation

## 2010-09-18 DIAGNOSIS — D649 Anemia, unspecified: Secondary | ICD-10-CM | POA: Insufficient documentation

## 2010-09-18 DIAGNOSIS — L989 Disorder of the skin and subcutaneous tissue, unspecified: Secondary | ICD-10-CM | POA: Insufficient documentation

## 2010-09-18 DIAGNOSIS — R509 Fever, unspecified: Secondary | ICD-10-CM | POA: Insufficient documentation

## 2010-09-18 DIAGNOSIS — L2989 Other pruritus: Secondary | ICD-10-CM | POA: Insufficient documentation

## 2010-09-18 DIAGNOSIS — R042 Hemoptysis: Secondary | ICD-10-CM | POA: Insufficient documentation

## 2010-09-18 DIAGNOSIS — L298 Other pruritus: Secondary | ICD-10-CM | POA: Insufficient documentation

## 2010-09-18 DIAGNOSIS — R5381 Other malaise: Secondary | ICD-10-CM | POA: Insufficient documentation

## 2010-09-18 DIAGNOSIS — R0602 Shortness of breath: Secondary | ICD-10-CM | POA: Insufficient documentation

## 2010-09-18 LAB — DIFFERENTIAL
Basophils Absolute: 0.1 10*3/uL (ref 0.0–0.1)
Basophils Relative: 1 % (ref 0–1)
Eosinophils Absolute: 0.1 10*3/uL (ref 0.0–0.7)
Monocytes Absolute: 0.6 10*3/uL (ref 0.1–1.0)
Monocytes Relative: 9 % (ref 3–12)
Neutrophils Relative %: 58 % (ref 43–77)

## 2010-09-18 LAB — CBC
MCH: 23.7 pg — ABNORMAL LOW (ref 26.0–34.0)
MCHC: 31.1 g/dL (ref 30.0–36.0)
Platelets: 414 10*3/uL — ABNORMAL HIGH (ref 150–400)

## 2010-09-18 LAB — URINALYSIS, ROUTINE W REFLEX MICROSCOPIC
Ketones, ur: NEGATIVE mg/dL
Leukocytes, UA: NEGATIVE
Nitrite: NEGATIVE
Protein, ur: NEGATIVE mg/dL
Urobilinogen, UA: 0.2 mg/dL (ref 0.0–1.0)

## 2010-09-18 LAB — COMPREHENSIVE METABOLIC PANEL
AST: 15 U/L (ref 0–37)
Albumin: 3.7 g/dL (ref 3.5–5.2)
Calcium: 9.1 mg/dL (ref 8.4–10.5)
Creatinine, Ser: 0.51 mg/dL (ref 0.50–1.10)
GFR calc non Af Amer: >60 mL/min (ref >60)
Total Protein: 6.9 g/dL (ref 6.0–8.3)

## 2010-11-09 LAB — POCT PREGNANCY, URINE: Operator id: 268271

## 2010-11-09 LAB — POCT HEMOGLOBIN-HEMACUE: Operator id: 268271

## 2010-12-30 DIAGNOSIS — M797 Fibromyalgia: Secondary | ICD-10-CM

## 2010-12-30 HISTORY — DX: Fibromyalgia: M79.7

## 2011-03-02 DIAGNOSIS — I776 Arteritis, unspecified: Secondary | ICD-10-CM

## 2011-03-02 HISTORY — DX: Arteritis, unspecified: I77.6

## 2011-03-14 ENCOUNTER — Encounter (INDEPENDENT_AMBULATORY_CARE_PROVIDER_SITE_OTHER): Payer: Self-pay | Admitting: General Surgery

## 2011-03-14 ENCOUNTER — Ambulatory Visit (INDEPENDENT_AMBULATORY_CARE_PROVIDER_SITE_OTHER): Payer: 59 | Admitting: General Surgery

## 2011-03-14 VITALS — BP 140/78 | HR 68 | Temp 98.4°F | Resp 16 | Ht 66.0 in | Wt 149.6 lb

## 2011-03-14 DIAGNOSIS — E079 Disorder of thyroid, unspecified: Secondary | ICD-10-CM

## 2011-03-14 NOTE — Progress Notes (Signed)
Patient ID: [name], female   DOB: 05-05-1970, 41 y.o.   MRN: [id number]  Chief Complaint  Patient presents with  . Goiter    HPI [name] is a 41 y.o. female.   HPI This patient is referred for evaluation of a thyroid nodule. She states that she has been feeling bad for quite a while with feelings of decreased energy and overall "deteriorating". She has felt tired and has even fell asleep while working and has other problems such as arthritis and swelling in her feet, joint pains and fluctuating weight. She saw her primary care doctor for evaluation and in the workup had thyroid ultrasound which demonstrated a diffusely enlarged thyroid with multiple hypoechoic areas concerning for possible multinodular goiter with the increasing size of the nodule in the right lower pole with some concerning features. This was compared to a prior ultrasound and obtain 2009 which demonstrated some enlargement in the nodule and further evaluation was recommended. Upon questioning the patient she has no swallowing problems although she does feel that her voice gets a scratchy. She denies any palpable nodules or lumps in her neck. She denies any radiation treatment for exposure although she states her mom had a thyroid ablation several years ago for benign disease. She says that she's had occasional palpitations irregular heartbeat with some shortness of breath and feels winded frequently as well as some other complaints of fatigue but has not been diagnosed with any formal endocrine problems. Past Medical History  Diagnosis Date  . Goiter     No past surgical history on file.  No family history on file.  Social History History  Substance Use Topics  . Smoking status: Never Smoker   . Smokeless tobacco: Never Used  . Alcohol Use: No    Allergies  Allergen Reactions  . Doxycycline   . Tetracycline     No current outpatient prescriptions on file.    Review of Systems Review of Systems All  other review of systems negative or noncontributory except as stated in the HPI  Blood pressure 140/78, pulse 68, temperature 98.4 F (36.9 C), temperature source Temporal, resp. rate 16, height 5\' 6"  (1.676 m), weight 149 lb 9.6 oz (67.858 kg).  Physical Exam Physical Exam Physical Exam  Nursing note and vitals reviewed. Constitutional: She is oriented to person, place, and time. She appears well-developed and well-nourished. No distress.  HENT: thyroid is palpable but no nodules or LAD Head: Normocephalic and atraumatic.  Mouth/Throat: No oropharyngeal exudate.  Eyes: Conjunctivae and EOM are normal. Pupils are equal, round, and reactive to light. Right eye exhibits no discharge. Left eye exhibits no discharge. No scleral icterus.  Neck: Normal range of motion. Neck supple. No tracheal deviation present.  Cardiovascular: Normal rate, regular rhythm, normal heart sounds and intact distal pulses.   Pulmonary/Chest: Effort normal and breath sounds normal. No stridor. No respiratory distress. She has no wheezes.  Abdominal: Soft. Bowel sounds are normal. She exhibits no distension and no mass. There is no tenderness. There is no rebound and no guarding.  Musculoskeletal: Normal range of motion. She exhibits no edema and no tenderness.  Neurological: She is alert and oriented to person, place, and time.  Skin: Skin is warm and dry. No rash noted. She is not diaphoretic. No erythema. No pallor.  Psychiatric: She has a normal mood and affect. Her behavior is normal. Judgment and thought content normal.    Data Reviewed   Assessment    thyroid nodule,  likely multinodular goiter  I cannot say that her symptoms are related to her thyroid nodules however she does have a concerning nodule in the right lower lobe of her thyroid which has increased in size. I cannot palpate his on exam there is no other lymphadenopathy. I have discussed with her the possibility of multinodular goiter versus benign  goiter versus thyroid cancer and since this nodule has increased in size and does show some vascularity and other concerning features have recommended FNA of the nodule for further diagnosis. I would also recommend thyroid function tests and we will see her back after these to discuss further management.    Plan    FNA of the right thyroid nodule and thyroid function tests and she will followup after these.       Lodema Pilot DAVID 03/14/2011, 5:19 PM

## 2011-03-15 ENCOUNTER — Telehealth (INDEPENDENT_AMBULATORY_CARE_PROVIDER_SITE_OTHER): Payer: Self-pay

## 2011-03-15 NOTE — Telephone Encounter (Signed)
Called [name] at home, work and [employer] number's as well as spouse's phone numbers we have on file.  Not able to leave a message on any of the numbers we have on file(voice mail not set up)

## 2011-03-16 LAB — TSH: TSH: 1.324 u[IU]/mL (ref 0.350–4.500)

## 2011-03-20 ENCOUNTER — Other Ambulatory Visit (HOSPITAL_COMMUNITY)
Admission: RE | Admit: 2011-03-20 | Discharge: 2011-03-20 | Disposition: A | Discharge status: Home / Self Care | Payer: 59 | Source: Ambulatory Visit | Attending: Interventional Radiology | Admitting: Interventional Radiology

## 2011-03-20 ENCOUNTER — Ambulatory Visit
Admission: RE | Admit: 2011-03-20 | Discharge: 2011-03-20 | Disposition: A | Discharge status: Home / Self Care | Payer: 59 | Source: Ambulatory Visit | Attending: General Surgery | Admitting: General Surgery

## 2011-03-20 DIAGNOSIS — E079 Disorder of thyroid, unspecified: Secondary | ICD-10-CM

## 2011-03-20 DIAGNOSIS — E049 Nontoxic goiter, unspecified: Secondary | ICD-10-CM | POA: Insufficient documentation

## 2011-03-22 ENCOUNTER — Telehealth (INDEPENDENT_AMBULATORY_CARE_PROVIDER_SITE_OTHER): Payer: Self-pay

## 2011-03-22 NOTE — Telephone Encounter (Signed)
Pt calling requesting thyroid bx results. I notified pt that her results show a benign follicular nodule but Dr Biagio Quint would have to look over the results to give her the final decision on what to do next. The pt wants to talk to Dr Biagio Quint about her options of surgery. Please advise.

## 2011-03-23 ENCOUNTER — Telehealth (INDEPENDENT_AMBULATORY_CARE_PROVIDER_SITE_OTHER): Payer: Self-pay | Admitting: General Surgery

## 2011-03-23 ENCOUNTER — Other Ambulatory Visit (INDEPENDENT_AMBULATORY_CARE_PROVIDER_SITE_OTHER): Payer: Self-pay | Admitting: General Surgery

## 2011-03-23 ENCOUNTER — Telehealth (INDEPENDENT_AMBULATORY_CARE_PROVIDER_SITE_OTHER): Payer: Self-pay

## 2011-03-23 DIAGNOSIS — E042 Nontoxic multinodular goiter: Secondary | ICD-10-CM

## 2011-03-23 NOTE — Telephone Encounter (Signed)
Discussed path reports and lab results from recent thyroid FNA.  Path benign and tsh/t4 normal.  I recommended 6 month f/u US and eval by endocrinologist and rheumatologist for all of her other symptoms.

## 2011-03-23 NOTE — Telephone Encounter (Signed)
Patient left message requesting more information on her treatment plan, biopsy results given to her on 03/22/11 by Santiago Bur.  Paged Dr. Biagio Quint to call patient at his convenience to discuss in more detail.

## 2011-03-28 ENCOUNTER — Other Ambulatory Visit: Payer: Self-pay | Admitting: Family Medicine

## 2011-03-28 ENCOUNTER — Ambulatory Visit
Admission: RE | Admit: 2011-03-28 | Discharge: 2011-03-28 | Disposition: A | Discharge status: Home / Self Care | Payer: 59 | Source: Ambulatory Visit | Attending: Family Medicine | Admitting: Family Medicine

## 2011-03-28 DIAGNOSIS — R42 Dizziness and giddiness: Secondary | ICD-10-CM

## 2011-03-28 MED ORDER — GADOBENATE DIMEGLUMINE 529 MG/ML IV SOLN
14.0000 mL | Freq: Once | INTRAVENOUS | Status: AC | PRN
  Administered 2011-03-28: 14 mL via INTRAVENOUS

## 2011-03-30 ENCOUNTER — Encounter (INDEPENDENT_AMBULATORY_CARE_PROVIDER_SITE_OTHER): Payer: 59 | Admitting: General Surgery

## 2011-03-30 NOTE — Telephone Encounter (Signed)
Message given to Dr. Biagio Quint.

## 2011-04-06 ENCOUNTER — Encounter: Payer: Self-pay | Admitting: Physical Medicine and Rehabilitation

## 2011-04-10 ENCOUNTER — Encounter (INDEPENDENT_AMBULATORY_CARE_PROVIDER_SITE_OTHER): Payer: Self-pay

## 2011-04-11 ENCOUNTER — Telehealth: Payer: Self-pay | Admitting: *Deleted

## 2011-04-11 NOTE — Telephone Encounter (Signed)
Patient wanted [place] to let you know some information to put in her chart. She said that her "skin issue" that she mentioned sometime back has been diagnosed.  She said she has "Vasculitis". And said she will be seeing a Publishing rights manager at [facility].  (Will put chart on your desk for your return)

## 2011-04-12 ENCOUNTER — Encounter: Payer: Self-pay | Admitting: Gynecology

## 2011-04-12 ENCOUNTER — Telehealth (INDEPENDENT_AMBULATORY_CARE_PROVIDER_SITE_OTHER): Payer: Self-pay | Admitting: General Surgery

## 2011-05-14 ENCOUNTER — Encounter: Payer: 59 | Attending: Physical Medicine and Rehabilitation | Admitting: Physical Medicine and Rehabilitation

## 2011-05-14 ENCOUNTER — Encounter: Payer: Self-pay | Admitting: Physical Medicine and Rehabilitation

## 2011-05-14 VITALS — BP 155/90 | HR 108 | Resp 16 | Ht 65.0 in | Wt 142.8 lb

## 2011-05-14 DIAGNOSIS — M79609 Pain in unspecified limb: Secondary | ICD-10-CM | POA: Insufficient documentation

## 2011-05-14 DIAGNOSIS — M25561 Pain in right knee: Secondary | ICD-10-CM

## 2011-05-14 DIAGNOSIS — G8929 Other chronic pain: Secondary | ICD-10-CM

## 2011-05-14 DIAGNOSIS — M25562 Pain in left knee: Secondary | ICD-10-CM

## 2011-05-14 DIAGNOSIS — R51 Headache: Secondary | ICD-10-CM

## 2011-05-14 DIAGNOSIS — IMO0001 Reserved for inherently not codable concepts without codable children: Secondary | ICD-10-CM | POA: Insufficient documentation

## 2011-05-14 DIAGNOSIS — M542 Cervicalgia: Secondary | ICD-10-CM

## 2011-05-14 DIAGNOSIS — M25569 Pain in unspecified knee: Secondary | ICD-10-CM | POA: Insufficient documentation

## 2011-05-14 NOTE — Progress Notes (Signed)
Addended by: Doreene Eland on: 05/14/2011 10:22 AM   Modules accepted: Orders

## 2011-05-14 NOTE — Progress Notes (Signed)
Subjective:    Patient ID: [name], female    DOB: [birth year], 41 y.o.   MRN: [id number] The patient is a 41 year old married right-handed woman who lives with her 2 daughters 77 and 75 years old. She also has a granddaughter 75-year-old living at home as well.  She presents today with chief complaint of bilateral shoulder blade pain. This began in 2004 after an innertube accident. She was eventually diagnosed with a history of thoracic outlet syndrome. She reports she has had previous nurse studies done at [facility] in 2005 or 2006.she describes her pain as as it 8 on a scale of 10 if she does not use lidocaine patch his. She reports the pain is constant while she is awake she describes it as burning and aching. TENS unit was not helpful.her last MRI of her cervical spine was back in 2005. This was done at [facility] but her she's not entirely sure.   She also reports left upper extremity pain which radiates from the elbow to the the medial to fingers. A scratch this pain as 6 on a scale of 10 described a stabbing tingling in shooting occurring well she's awake. She reports Valium was taken at night for the specific pain. In the left upper extremity is aggravated by abduction of the left shoulder.   Her third problem today is bilateral knee pain. This began approximately 3 months ago she describes it as a stabbing aching especially when she walks and bends her knees. Stairs also aggravate. There is no history of trauma. This had a gradual onset.   HPI Pain Inventory Average Pain 9 Pain Right Now 8 My pain is constant, sharp, burning, dull, tingling and aching  In the last 24 hours, has pain interfered with the following? General activity 10 Relation with others 10 Enjoyment of life 10 What TIME of day is your pain at its worst? all of the time Sleep (in general) Poor  Pain is worse with: walking, bending, sitting, inactivity, standing and some activites Pain improves with: rest and  medication Relief from Meds: 2  Mobility walk without assistance how many minutes can you walk? 30 ability to climb steps?  no do you drive?  yes Do you have any goals in this area?  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  yes  Neuro/Psych bladder control problems weakness numbness tingling trouble walking spasms dizziness confusion  Prior Studies Any changes since last visit?  yes CT/MRI CT 03/2011, MRI for thoracic outlet 2005  Physicians involved in your care Primary care [facility] Rheumatologist Dr Ang Orthopedist Dr Pati Gallo Dr Beola Cord treats thyroid/fibromyalgia       Review of Systems  Constitutional: Positive for chills, diaphoresis, appetite change and unexpected weight change.       Fever, night sweats, weight gain and loss, poor appetite  Respiratory: Positive for apnea, cough, shortness of breath and wheezing.   Cardiovascular: Positive for leg swelling.  Gastrointestinal: Positive for constipation.  Genitourinary: Positive for urgency and frequency.  Musculoskeletal: Positive for myalgias and arthralgias.  Skin:       Ulcers from vasculitis  Neurological: Positive for dizziness, weakness and numbness.  Psychiatric/Behavioral: Positive for confusion.  All other systems reviewed and are negative.       Objective:   Physical Exam  She is alert and oriented x3 she is cooperative and pleasant. She follows commands without difficulty answers to questions appropriately  Skin is remarkable for multiple healing small scabs over back upper extremities lower extremities and face  Cranial nerves and coordination are intact  Reflexes are brisk in the upper extremities with the exception of right and left triceps are slightly diminished lower extremity reflexes are brisk no clonus  is noted  Hoffmann sign is negative bilaterally  Pinprick and light touch are intact in upper and lower extremities  Vibratory sensation is intact  Manual muscle testing bilateral upper and lower extremities did not reveal focal deficits  Straight leg raise is negative  Range of motion in cervical spine is near normal pain is noted with flexion and extension of her neck.   She has full range of motion of the shoulders with abduction internal rotation is limited in both shoulders however she does not complain of shoulder pain. She does have peri scapular pain.  Lumbar motion is fairly well preserved she does complain of some low back pain with movement however  Flexion-extension at the knees reveals normal range of motion. She does have crepitus at both of the patella. Joint line tenderness is not present no effusion is noted.       Assessment & Plan:   1. Chronic Cervicalgia with radiation to shoulder blades.  Patient currently carries a diagnosis of fibromyalgia as well as thoracic outlet syndrome. Patient does not really recall when her last cervical MRI was done she believes it was some time in 2004. She does have a history of trauma remotely.  2. 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  Request notes from Dr. Thurnell Lose orthopedics  Request notes from neurology department [facility] specifically electrodiagnostic report  Cervical radiographs including flexion-extension views.  MRI cervical spine without contrast  Bilateral knee radiographs including sunrise view  We also had a discussion regarding her Valium today. She told me she is see sleepy a lot. I mentioned to her that diazapam has a half life of 30-60 hours. I also told her Valium has active metabolites which can last 30-100 hours. I told her I did not anticipate including this in her pain management plan.  Set up for  upper extremity electrodes diagnostic studies.   I mentioned to her the importance of physical therapy and helping manage knee pain as well as fibromyalgia. She seems open to this today. I will hold off on ordering physical therapy until I get more information from previous specialists who have seen her. We'll see her back in one month.

## 2011-05-14 NOTE — Patient Instructions (Signed)
Followup in one month  Request notes from Dr. Allena Napoleon at [facility]  Request notes from Dr. Thurnell Lose orthopedics  Request notes from neurology department [facility] specifically electrodiagnostic report  Cervical radiographs including flexion-extension views.  MRI cervical spine without contrast  Bilateral knee radiographs including sunrise view  ElectroDiagnostic studies

## 2011-05-20 ENCOUNTER — Ambulatory Visit (HOSPITAL_COMMUNITY)
Admission: RE | Admit: 2011-05-20 | Discharge: 2011-05-20 | Disposition: A | Discharge status: Home / Self Care | Payer: 59 | Source: Ambulatory Visit | Attending: Physical Medicine and Rehabilitation | Admitting: Physical Medicine and Rehabilitation

## 2011-05-20 DIAGNOSIS — M436 Torticollis: Secondary | ICD-10-CM | POA: Insufficient documentation

## 2011-05-20 DIAGNOSIS — R209 Unspecified disturbances of skin sensation: Secondary | ICD-10-CM | POA: Insufficient documentation

## 2011-05-20 DIAGNOSIS — M542 Cervicalgia: Secondary | ICD-10-CM | POA: Insufficient documentation

## 2011-05-20 DIAGNOSIS — M898X1 Other specified disorders of bone, shoulder: Secondary | ICD-10-CM

## 2011-05-20 DIAGNOSIS — G8929 Other chronic pain: Secondary | ICD-10-CM

## 2011-05-31 ENCOUNTER — Telehealth: Payer: Self-pay | Admitting: *Deleted

## 2011-05-31 NOTE — Telephone Encounter (Signed)
Returning Dr Leretha Dykes call.

## 2011-05-31 NOTE — Telephone Encounter (Signed)
LM with a female to have pt call us about her UDS results.

## 2011-06-01 ENCOUNTER — Telehealth: Payer: Self-pay | Admitting: Physical Medicine and Rehabilitation

## 2011-06-01 NOTE — Telephone Encounter (Signed)
Pt states that she was out of Hydrocodone before test was done.

## 2011-06-01 NOTE — Telephone Encounter (Signed)
R/T call.  Is it about MRI?

## 2011-06-04 ENCOUNTER — Telehealth: Payer: Self-pay

## 2011-06-04 NOTE — Telephone Encounter (Signed)
Pt called requesting MRI results.  

## 2011-06-04 NOTE — Telephone Encounter (Signed)
Tried to call pt to let her know that results are back but we are waiting for Dr. Pamelia Hoit to take a look at them.  Please look at MRI results.

## 2011-06-04 NOTE — Telephone Encounter (Signed)
Spoke to pt on Friday about UDS results. I advised her that when her MRI comes back someone will give her a call.

## 2011-06-05 ENCOUNTER — Telehealth: Payer: Self-pay | Admitting: Physical Medicine and Rehabilitation

## 2011-06-05 NOTE — Telephone Encounter (Signed)
Mri results.  Very uncomfortable.

## 2011-06-06 NOTE — Telephone Encounter (Signed)
Pt has been made aware of results

## 2011-06-06 NOTE — Telephone Encounter (Signed)
Let pt know that as soon as Dr. Pamelia Hoit looks at MRI someone will call her.  Please address MRI. Thanks.

## 2011-06-06 NOTE — Telephone Encounter (Signed)
Pt aware MRI  results

## 2011-06-06 NOTE — Telephone Encounter (Signed)
Minimal degenerative disease of the cervical spine without central  canal or foraminal stenosis  Please let the patient know above result of cervical mri. I will answer questions about it at the next visit.

## 2011-06-06 NOTE — Telephone Encounter (Signed)
Minimal degenerative disease of the cervical spine without central  canal or foraminal stenosis.   Please let the patient know result of cervical mri

## 2011-06-13 ENCOUNTER — Encounter: Payer: 59 | Attending: Physical Medicine and Rehabilitation | Admitting: Physical Medicine and Rehabilitation

## 2011-06-13 ENCOUNTER — Encounter: Payer: Self-pay | Admitting: Physical Medicine and Rehabilitation

## 2011-06-13 VITALS — BP 153/85 | HR 112 | Resp 14 | Ht 66.0 in | Wt 144.0 lb

## 2011-06-13 DIAGNOSIS — IMO0001 Reserved for inherently not codable concepts without codable children: Secondary | ICD-10-CM | POA: Insufficient documentation

## 2011-06-13 DIAGNOSIS — M542 Cervicalgia: Secondary | ICD-10-CM | POA: Insufficient documentation

## 2011-06-13 DIAGNOSIS — M25569 Pain in unspecified knee: Secondary | ICD-10-CM | POA: Insufficient documentation

## 2011-06-13 DIAGNOSIS — G8929 Other chronic pain: Secondary | ICD-10-CM | POA: Insufficient documentation

## 2011-06-13 DIAGNOSIS — M79609 Pain in unspecified limb: Secondary | ICD-10-CM | POA: Insufficient documentation

## 2011-06-13 DIAGNOSIS — M797 Fibromyalgia: Secondary | ICD-10-CM

## 2011-06-13 NOTE — Progress Notes (Signed)
Addended by: Ashok Cordia on: 06/13/2011 12:11 PM   Modules accepted: Level of Service

## 2011-06-13 NOTE — Patient Instructions (Signed)
F/u Gerald Honea after records are reviewed.   Request notes from Dr. Allena Napoleon at [facility]  Request notes from Dr. Thurnell Lose orthopedics  Request notes from neurology department [facility] specifically electrodiagnostic report

## 2011-06-13 NOTE — Progress Notes (Signed)
Subjective:    Patient ID: [name], female    DOB: 02-Apr-1970, 41 y.o.   MRN: [id number]  HPI  Widespread pain.  Worse in periscapular area, low back,hands, and knees occasionally.  Knee are not as bad today.    Patient has been using Lidoderm patches daily on peers scapular area as well as low back. She is also using tramadol times a day and ibuprofen 800 mg 3 times a day.  She has tried a TENS unit but has not used recently  Is not trialed modalities such as heat or cold.  She has a hot tub at home which she can walk around in.      Pain Inventory Average Pain 8 Pain Right Now 8 My pain is constant, burning, dull, stabbing, tingling and aching  In the last 24 hours, has pain interfered with the following? General activity 2 Relation with others 3 Enjoyment of life 4 What TIME of day is your pain at its worst? all the time Sleep (in general) Fair  Pain is worse with: walking, bending, sitting, inactivity, standing and some activites Pain improves with: rest and medication Relief from Meds: 3  Mobility walk without assistance how many minutes can you walk? 25-30 min ability to climb steps?  yes do you drive?  yes Do you have any goals in this area?  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  yes  Neuro/Psych bladder control problems weakness numbness tingling trouble walking  Prior Studies Any changes since last visit?  no x-rays CT/MRI nerve study  Physicians involved in your care Dr Lowell Guitar       Review of Systems  Constitutional: Positive for diaphoresis and unexpected weight change.  Respiratory: Positive for apnea.   Gastrointestinal: Positive for constipation.  Musculoskeletal: Positive for joint swelling.  All other systems reviewed and are negative.       Objective:   Physical Exam  She is alert and oriented x3 she is cooperative and pleasant. She follows commands without difficulty answers to questions appropriately Tearful at times but says she is not depressed.  Upset over pain and function. Skin is remarkable for multiple healing small scabs over back upper extremities lower extremities and face  Cranial nerves and coordination are intact    Reflexes are brisk in the upper extremities with the exception of right and left triceps are slightly diminished lower extremity reflexes are brisk no clonus is noted  Hoffmann sign is negative bilaterally    Pinprick and light touch are intact in upper and lower extremities  Vibratory sensation is intact    Manual muscle testing bilateral upper and lower extremities did not reveal focal deficits    Straight leg raise is negative    Range of motion in cervical spine is near normal pain is noted with flexion and extension of her neck.  She has full range of motion of the shoulders with abduction internal rotation is limited in both shoulders however she does not complain of shoulder pain.    She does have peri scapular pain with palpation.    Lumbar motion is fairly well preserved she does complain of some low back pain with movement however    Flexion-extension at the knees reveals normal range of motion. She does have crepitus at both of the patella. Joint line tenderness is not  present no effusion is noted.                 MRI CERVICAL SPINE WITHOUT CONTRAST 05/20/11   Technique: Multiplanar and multiecho pulse sequences of the  cervical spine, to include the craniocervical junction and  cervicothoracic junction, were obtained according to standard  protocol without intravenous contrast.  Comparison: None.  Findings: Vertebral body height, signal and alignment are  maintained. The craniocervical junction is normal and cervical  cord signal is normal. Small mucous retention cyst or  polyp is  seen the floor of the left maxillary sinus. Visualized paraspinous  structures are unremarkable.  C2-3: Negative.  C3-4: Negative.  C4-5: Minimal uncovertebral disease in the right. Central canal  and foramina widely patent.  C5-6: Slight disc bulge without central canal or foraminal  narrowing.  C6-7: Negative.  C7-T1: Negative.  IMPRESSION:  Minimal degenerative disease of the cervical spine without central  canal or foraminal stenosis.  Original Report Authenticated By: Bernadene Bell. Maricela Curet, M.D.  Assessment & Plan:  1. Chronic widespread pain/fibromyalgia   2. Chronic Cervicalgia with radiation to shoulder blades. Patient currently carries a diagnosis of fibromyalgia as well as thoracic outlet syndrome. MRI is reviewed with patient   3. 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  I suggested physical therapy to address fibromyalgia type pain however patient states that she does not have financial resources to comply with this option.  I suggested that considering the use of modalities. She is requesting stronger pain medications however I'm not comfortable using strong narcotics for her at this time.  TENS unit tried in past not helpful    Request notes from Dr. Allena [facility] at [facility]  Request notes from Dr. Thurnell Lose orthopedics  Request notes from neurology department [facility] specifically electrodiagnostic report    She tells me she has a history of vasculitis.  She has gotten some topical agents for her skin from Dr. Allena Napoleon.  We also had a discussion regarding her Valium today. She told me she is see sleepy a lot. I mentioned to her that diazapam has a half life of 30-60 hours. I also told her Valium has active metabolites which can last 30-100 hours. I told her I did not anticipate including this in her pain management  plan.       I mentioned to her the importance of physical therapy and helping manage knee pain as well as fibromyalgia. She seems open to this today. I will hold off on ordering physical therapy until I get more information from previous specialists who have seen her. We'll see her back in one month.

## 2011-07-04 ENCOUNTER — Telehealth: Payer: Self-pay | Admitting: *Deleted

## 2011-07-04 NOTE — Telephone Encounter (Signed)
Has an appointment with Dr. Pamelia Hoit on Friday and wants to make sure we have received all the records that Dr. Pamelia Hoit asked for. I spoke to April and she believes that we have received the records. Pt aware.

## 2011-07-09 ENCOUNTER — Encounter: Payer: 59 | Admitting: Physical Medicine and Rehabilitation

## 2011-07-30 ENCOUNTER — Telehealth: Payer: Self-pay | Admitting: *Deleted

## 2011-07-30 NOTE — Telephone Encounter (Signed)
[name] had to reschedule appointment to 7/24 due to 2nd opinion and now has to cancel due to being treated by Dr Pervis Hocking @ [facility] and he has her on high dose of Prednisone for two weeks, and they are trying to decide if she has lupus or chron's dz

## 2011-08-03 ENCOUNTER — Encounter: Payer: 59 | Admitting: Physical Medicine and Rehabilitation

## 2011-08-20 ENCOUNTER — Telehealth: Payer: Self-pay | Admitting: *Deleted

## 2011-08-20 NOTE — Telephone Encounter (Signed)
FYI - seeing a doctor at [facility], he is taking care of her pain management needs. May need Dr. Pamelia Hoit at a later date.

## 2011-08-22 ENCOUNTER — Ambulatory Visit: Payer: 59 | Admitting: Physical Medicine and Rehabilitation

## 2011-09-05 ENCOUNTER — Encounter (HOSPITAL_COMMUNITY): Payer: Self-pay | Admitting: Emergency Medicine

## 2011-09-05 DIAGNOSIS — K219 Gastro-esophageal reflux disease without esophagitis: Secondary | ICD-10-CM | POA: Insufficient documentation

## 2011-09-05 DIAGNOSIS — M129 Arthropathy, unspecified: Secondary | ICD-10-CM | POA: Insufficient documentation

## 2011-09-05 DIAGNOSIS — I7300 Raynaud's syndrome without gangrene: Secondary | ICD-10-CM | POA: Insufficient documentation

## 2011-09-05 DIAGNOSIS — M7989 Other specified soft tissue disorders: Secondary | ICD-10-CM | POA: Insufficient documentation

## 2011-09-05 DIAGNOSIS — I10 Essential (primary) hypertension: Secondary | ICD-10-CM | POA: Insufficient documentation

## 2011-09-05 LAB — CBC WITH DIFFERENTIAL/PLATELET
Eosinophils Absolute: 0 10*3/uL (ref 0.0–0.7)
Eosinophils Relative: 0 % (ref 0–5)
Lymphs Abs: 2.5 10*3/uL (ref 0.7–4.0)
MCH: 27.5 pg (ref 26.0–34.0)
MCHC: 31.3 g/dL (ref 30.0–36.0)
MCV: 87.8 fL (ref 78.0–100.0)
Platelets: 477 10*3/uL — ABNORMAL HIGH (ref 150–400)
RBC: 3.93 MIL/uL (ref 3.87–5.11)
RDW: 13 % (ref 11.5–15.5)

## 2011-09-05 LAB — POCT I-STAT TROPONIN I

## 2011-09-05 NOTE — ED Notes (Addendum)
Pt was dx with vasculitis in feb sees Dr Radonna Ricker at [facility]; 2 days ago reports got worse --having hot flashes, feeling more fatigue, having heart pounding and increased pain in L calf; took last 2 percocets at 8pm and 800 ibuprofen at 2pm; CMS intact + DP pulse

## 2011-09-06 ENCOUNTER — Emergency Department (HOSPITAL_COMMUNITY)
Admission: EM | Admit: 2011-09-06 | Discharge: 2011-09-06 | Disposition: A | Discharge status: Home / Self Care | Payer: BC Managed Care – PPO | Attending: Emergency Medicine | Admitting: Emergency Medicine

## 2011-09-06 ENCOUNTER — Ambulatory Visit (HOSPITAL_COMMUNITY)
Admission: RE | Admit: 2011-09-06 | Discharge: 2011-09-06 | Disposition: A | Discharge status: Home / Self Care | Payer: BC Managed Care – PPO | Source: Ambulatory Visit | Attending: Emergency Medicine | Admitting: Emergency Medicine

## 2011-09-06 DIAGNOSIS — M79609 Pain in unspecified limb: Secondary | ICD-10-CM | POA: Insufficient documentation

## 2011-09-06 DIAGNOSIS — IMO0001 Reserved for inherently not codable concepts without codable children: Secondary | ICD-10-CM | POA: Insufficient documentation

## 2011-09-06 DIAGNOSIS — I776 Arteritis, unspecified: Secondary | ICD-10-CM | POA: Insufficient documentation

## 2011-09-06 DIAGNOSIS — M7989 Other specified soft tissue disorders: Secondary | ICD-10-CM

## 2011-09-06 HISTORY — DX: Raynaud's syndrome without gangrene: I73.00

## 2011-09-06 HISTORY — DX: Pyoderma gangrenosum: L88

## 2011-09-06 LAB — BASIC METABOLIC PANEL
Calcium: 9.4 mg/dL (ref 8.4–10.5)
GFR calc non Af Amer: >90 mL/min (ref >90)
Glucose, Bld: 115 mg/dL — ABNORMAL HIGH (ref 70–99)
Sodium: 136 mEq/L (ref 135–145)

## 2011-09-06 MED ORDER — ENOXAPARIN SODIUM 80 MG/0.8ML ~~LOC~~ SOLN
1.0000 mg/kg | Freq: Once | SUBCUTANEOUS | Status: AC
  Administered 2011-09-06: 03:00:00 via SUBCUTANEOUS
  Filled 2011-09-06: qty 0.8

## 2011-09-06 MED ORDER — ENOXAPARIN SODIUM 150 MG/ML ~~LOC~~ SOLN
1.0000 mg/kg | Freq: Once | SUBCUTANEOUS | Status: DC
  Filled 2011-09-06: qty 1

## 2011-09-06 NOTE — ED Notes (Signed)
Multiple areas of open wounds noted to entire body - hx of vasculitis; seeing a MD at [facility] for same; concerned tonight for increased pain to BLEs; concerned for DVTs; denies CP, SOB - reports generalized malaise which is "normal" for her

## 2011-09-06 NOTE — ED Notes (Addendum)
Pt took Valium 5 mg PO of own medication while awaiting EDP eval

## 2011-09-06 NOTE — Progress Notes (Signed)
Bilateral:  No evidence of DVT, superficial thrombosis, or Baker's Cyst.   

## 2011-09-06 NOTE — ED Provider Notes (Addendum)
History     CSN: [id number]  Arrival date & time 09/05/11  2256   First MD Initiated Contact with Patient 09/06/11 0106      Chief Complaint  Patient presents with  . Leg Pain    (Consider location/radiation/quality/duration/timing/severity/associated sxs/prior treatment) HPI 41 year old female with history of vasculitis presents emergency apartment with complaint of 3 days of worsening leg pain, swelling of left calf, generalized fatigue. Patient currently on prednisone taper for her vasculitis. She reports she's had history of charley horses in her right calf in the past, but none previously in her left calf. Patient is concerned about possible blood clot as she has not been as active as she normally is due to fatigue and pain. Patient is concerned about areas of ulcers on her skin being infected. She denies any fever or chills, no drainage of pus from the areas. Ulcerations have been present for some time and are secondary to her vasculitis. Some are healing since she's been on the prednisone, but others are worsening. Patient has followup early next week with her doctors at [facility] as well as a visit with the wound care clinic.  Past Medical History  Diagnosis Date  . Goiter   . Vasculitis limited to skin   . Vasculitis 03/2011  . Hypertension   . GERD (gastroesophageal reflux disease)   . Ulcer     from vasculitis  . Neuromuscular disorder   . Thoracic outlet syndrome 2004  . Fibromyalgia 12/2010  . Arthritis   . Pyodermic gangrenosum   . Raynaud's syndrome     Past Surgical History  Procedure Date  . Abdominal hysterectomy   . Ovarian cyst removal   . Right ovary removed      Family History  Problem Relation Age of Onset  . Diabetes Mother   . Hypertension Mother   . Cancer Mother 55    melanoma of the eye with mets    History  Substance Use Topics  . Smoking status: Never Smoker   . Smokeless tobacco: Never Used  . Alcohol Use: No    OB History    Grav Para  Term Preterm Abortions TAB SAB Ect Mult Living                  Review of Systems  Constitutional: Positive for activity change, appetite change and fatigue.  Cardiovascular: Positive for leg swelling.  Musculoskeletal: Positive for myalgias and arthralgias.  Skin: Positive for wound.  Neurological: Positive for weakness.  Hematological: Bruises/bleeds easily.  Psychiatric/Behavioral: Positive for decreased concentration. The patient is nervous/anxious.   All other systems reviewed and are negative.    Allergies  Doxycycline and Tetracycline  Home Medications   Current Outpatient Rx  Name Route Sig Dispense Refill  . DIAZEPAM 10 MG PO TABS Oral Take 10 mg by mouth at bedtime. For sleep    . DULOXETINE HCL 60 MG PO CPEP Oral Take 60 mg by mouth daily.    Marland Kitchen ESOMEPRAZOLE MAGNESIUM 40 MG PO CPDR Oral Take 40 mg by mouth daily before breakfast.    . GABAPENTIN 300 MG PO CAPS Oral Take 300 mg by mouth daily.     Marland Kitchen LEVOTHYROXINE SODIUM 75 MCG PO TABS Oral Take 75 mcg by mouth daily.    Marland Kitchen LIDOCAINE 5 % EX PTCH Transdermal Place 3 patches onto the skin daily. Remove & Discard patch within 12 hours or as directed by MD    . Claris Gladden POTASSIUM 100 MG PO TABS Oral  Take 100 mg by mouth daily.    . OXYCODONE-ACETAMINOPHEN 5-325 MG PO TABS Oral Take 2 tablets by mouth every 6 (six) hours as needed. For pain    . PREDNISONE 10 MG PO TABS Oral Take 50 mg by mouth daily. 50mg  for 3 weeks, then another taper will occur    . TRAMADOL HCL 50 MG PO TABS Oral Take 50 mg by mouth every 6 (six) hours as needed. For pain    . TRIAMCINOLONE ACETONIDE 0.1 % EX CREA Topical Apply 1 application topically 2 (two) times daily.      BP 124/75  Pulse 72  Temp 98.2 F (36.8 C) (Oral)  Resp 16  Wt 155 lb (70.308 kg)  SpO2 99%  Physical Exam  Nursing note and vitals reviewed. Constitutional: She is oriented to person, place, and time. She appears well-developed and well-nourished. She appears distressed  (uncomfortable and anxious appearing).  HENT:  Head: Normocephalic and atraumatic.  Neck: Normal range of motion. Neck supple. No JVD present. No tracheal deviation present. No thyromegaly present.  Cardiovascular: Normal rate, regular rhythm, normal heart sounds and intact distal pulses.  Exam reveals no gallop and no friction rub.   No murmur heard. Pulmonary/Chest: Effort normal and breath sounds normal. No stridor. No respiratory distress. She has no wheezes. She has no rales. She exhibits no tenderness.  Abdominal: Soft. Bowel sounds are normal. She exhibits no distension and no mass. There is no tenderness. There is no rebound and no guarding.  Musculoskeletal: She exhibits edema (asymmetric edema to lower legs left greater than right.) and tenderness (tenderness with palpation of bilateral calves left greater than right).  Lymphadenopathy:    She has no cervical adenopathy.  Neurological: She is alert and oriented to person, place, and time. She exhibits normal muscle tone. Coordination normal.  Skin: Skin is warm and dry. Rash (patient with multiple sites of punched out ulcerations covering arms legs. Some are healed with scar tissue, others with regulation tissue in the bases. No signs of cellulitis or ongoing infection involving any of these ulcerations) noted. There is erythema. No pallor.    ED Course  Procedures (including critical care time)  Labs Reviewed  CBC WITH DIFFERENTIAL - Abnormal; Notable for the following:    WBC 13.9 (*)     Hemoglobin 10.8 (*)     HCT 34.5 (*)     Platelets 477 (*)     Neutro Abs 10.5 (*)     All other components within normal limits  BASIC METABOLIC PANEL - Abnormal; Notable for the following:    Glucose, Bld 115 (*)     All other components within normal limits  POCT I-STAT TROPONIN I  PROTIME-INR  LAB REPORT - SCANNED   No results found.   Date: 09/05/2011  Rate: 97  Rhythm: normal sinus rhythm  QRS Axis: normal  Intervals:  normal  ST/T Wave abnormalities: normal  Conduction Disutrbances:none  Narrative Interpretation:   Old EKG Reviewed: unchanged    1. Calf swelling       MDM  41 year old female with left calf swelling and pain. I have some concern for potential DVT in this leg. Patient's ulcerations appears stable without active ongoing infection. She has good followup with her specialist at [facility]. We'll have her return in the morning for outpatient venous duplex study. We'll give dose of Lovenox here in the emergency department for overnight use.        Olivia Mackie, MD 09/06/11 [phone]  Olivia Mackie, MD 10/03/11 2111

## 2011-09-08 ENCOUNTER — Emergency Department (HOSPITAL_BASED_OUTPATIENT_CLINIC_OR_DEPARTMENT_OTHER): Payer: BC Managed Care – PPO

## 2011-09-08 ENCOUNTER — Encounter (HOSPITAL_BASED_OUTPATIENT_CLINIC_OR_DEPARTMENT_OTHER): Payer: Self-pay | Admitting: *Deleted

## 2011-09-08 ENCOUNTER — Emergency Department (HOSPITAL_BASED_OUTPATIENT_CLINIC_OR_DEPARTMENT_OTHER)
Admission: EM | Admit: 2011-09-08 | Discharge: 2011-09-09 | Disposition: A | Discharge status: Home / Self Care | Payer: BC Managed Care – PPO | Attending: Emergency Medicine | Admitting: Emergency Medicine

## 2011-09-08 DIAGNOSIS — K219 Gastro-esophageal reflux disease without esophagitis: Secondary | ICD-10-CM | POA: Insufficient documentation

## 2011-09-08 DIAGNOSIS — M545 Low back pain, unspecified: Secondary | ICD-10-CM | POA: Insufficient documentation

## 2011-09-08 DIAGNOSIS — R1032 Left lower quadrant pain: Secondary | ICD-10-CM | POA: Insufficient documentation

## 2011-09-08 DIAGNOSIS — IMO0002 Reserved for concepts with insufficient information to code with codable children: Secondary | ICD-10-CM | POA: Insufficient documentation

## 2011-09-08 DIAGNOSIS — I10 Essential (primary) hypertension: Secondary | ICD-10-CM | POA: Insufficient documentation

## 2011-09-08 DIAGNOSIS — N83209 Unspecified ovarian cyst, unspecified side: Secondary | ICD-10-CM

## 2011-09-08 DIAGNOSIS — R5381 Other malaise: Secondary | ICD-10-CM | POA: Insufficient documentation

## 2011-09-08 DIAGNOSIS — Z79899 Other long term (current) drug therapy: Secondary | ICD-10-CM | POA: Insufficient documentation

## 2011-09-08 DIAGNOSIS — I776 Arteritis, unspecified: Secondary | ICD-10-CM | POA: Insufficient documentation

## 2011-09-08 LAB — CBC WITH DIFFERENTIAL/PLATELET
Basophils Relative: 0 % (ref 0–1)
Eosinophils Relative: 0 % (ref 0–5)
Hemoglobin: 9.7 g/dL — ABNORMAL LOW (ref 12.0–15.0)
Lymphs Abs: 2.7 10*3/uL (ref 0.7–4.0)
MCH: 28.1 pg (ref 26.0–34.0)
MCV: 87.8 fL (ref 78.0–100.0)
Monocytes Absolute: 0.9 10*3/uL (ref 0.1–1.0)
Neutro Abs: 9.8 10*3/uL — ABNORMAL HIGH (ref 1.7–7.7)
RBC: 3.45 MIL/uL — ABNORMAL LOW (ref 3.87–5.11)

## 2011-09-08 LAB — BASIC METABOLIC PANEL
BUN: 12 mg/dL (ref 6–23)
CO2: 29 mEq/L (ref 19–32)
Chloride: 99 mEq/L (ref 96–112)
Creatinine, Ser: 0.6 mg/dL (ref 0.50–1.10)

## 2011-09-08 LAB — URINALYSIS, ROUTINE W REFLEX MICROSCOPIC
Bilirubin Urine: NEGATIVE
Glucose, UA: NEGATIVE mg/dL
Ketones, ur: NEGATIVE mg/dL
Leukocytes, UA: NEGATIVE
pH: 6 (ref 5.0–8.0)

## 2011-09-08 LAB — URINE MICROSCOPIC-ADD ON

## 2011-09-08 MED ORDER — HYDROMORPHONE HCL PF 1 MG/ML IJ SOLN
1.0000 mg | Freq: Once | INTRAMUSCULAR | Status: AC
  Administered 2011-09-08: 1 mg via INTRAVENOUS
  Filled 2011-09-08: qty 1

## 2011-09-08 NOTE — ED Provider Notes (Signed)
History     CSN: [id number]  Arrival date & time 09/08/11  2044   First MD Initiated Contact with Patient 09/08/11 2212      Chief Complaint  Patient presents with  . Back Pain    (Consider location/radiation/quality/duration/timing/severity/associated sxs/prior treatment) HPI Comments: Patient with hx vasculitis on chronic steroids and recent visit to ED for leg pain presents with 2-3 days of left lower quadrant abdominal pain.  States that she started noticing the pain the night she was in the ED, but did not mention it at the time.  States it started as a light pain that she thought was stomach upset from eating out, thought she might be getting diarrhea.  Pt never developed diarrhea.  Pain has become more constant and more intense, now a crampy pain with radiation to her left lower back.  Bowel movements have been normal for her (alternating constipation, diarrhea, occasional blood in her stool.  Pt normally has urinary urgency, has had decreased urgency and possible decreased urine output.  Denies N/V, abnormal vaginal discharge or bleeding.  Pt is s/p partial hysterectomy, right salping-oophorectomy (for cysts, fibroids, endometriosis).    Patient is a 41 y.o. female presenting with back pain and abdominal pain. The history is provided by the patient.  Back Pain  Associated symptoms include abdominal pain. Pertinent negatives include no dysuria.  Abdominal Pain The primary symptoms of the illness include abdominal pain and fatigue. The primary symptoms of the illness do not include nausea, vomiting, diarrhea, dysuria, vaginal discharge or vaginal bleeding. The current episode started more than 2 days ago. The onset of the illness was gradual. The problem has been gradually worsening.  The patient states that she believes she is currently not pregnant. The patient has not had a change in bowel habit. Risk factors for an acute abdominal problem include immunosuppression. Additional symptoms  associated with the illness include back pain. Symptoms associated with the illness do not include chills, anorexia, diaphoresis, heartburn, constipation, urgency, hematuria or frequency.    Past Medical History  Diagnosis Date  . Goiter   . Vasculitis limited to skin   . Vasculitis 03/2011  . Hypertension   . GERD (gastroesophageal reflux disease)   . Ulcer     from vasculitis  . Neuromuscular disorder   . Thoracic outlet syndrome 2004  . Fibromyalgia 12/2010  . Arthritis   . Pyodermic gangrenosum   . Raynaud's syndrome     Past Surgical History  Procedure Date  . Abdominal hysterectomy   . Ovarian cyst removal   . Right ovary removed      Family History  Problem Relation Age of Onset  . Diabetes Mother   . Hypertension Mother   . Cancer Mother 72    melanoma of the eye with mets    History  Substance Use Topics  . Smoking status: Never Smoker   . Smokeless tobacco: Never Used  . Alcohol Use: No    OB History    Grav Para Term Preterm Abortions TAB SAB Ect Mult Living                  Review of Systems  Constitutional: Positive for appetite change and fatigue. Negative for chills and diaphoresis.       Low grade fevers this week 99.9 Decreased appetite  Gastrointestinal: Positive for abdominal pain. Negative for heartburn, nausea, vomiting, diarrhea, constipation and anorexia.  Genitourinary: Negative for dysuria, urgency, frequency, hematuria, vaginal bleeding and vaginal discharge.  Musculoskeletal: Positive for back pain.    Allergies  Doxycycline and Tetracycline  Home Medications   Current Outpatient Rx  Name Route Sig Dispense Refill  . ACETAMINOPHEN 500 MG PO TABS Oral Take 1,000 mg by mouth every 6 (six) hours as needed. For pain    . ASPIRIN-CAFFEINE 500-32.5 MG PO TABS Oral Take 2 tablets by mouth once as needed. For pain    . BISMUTH SUBSALICYLATE 262 MG/15ML PO SUSP Oral Take 30 mLs by mouth once as needed. For stomach    . CALCIUM + D PO  Oral Take 2 tablets by mouth daily.    Marland Kitchen DIAZEPAM 10 MG PO TABS Oral Take 10 mg by mouth at bedtime. For sleep    . DULOXETINE HCL 60 MG PO CPEP Oral Take 60 mg by mouth daily.    Marland Kitchen ESOMEPRAZOLE MAGNESIUM 40 MG PO CPDR Oral Take 40 mg by mouth daily before breakfast.    . GABAPENTIN 300 MG PO CAPS Oral Take 300 mg by mouth daily.     Marland Kitchen LEVOTHYROXINE SODIUM 75 MCG PO TABS Oral Take 75 mcg by mouth daily.    Marland Kitchen LIDOCAINE 5 % EX PTCH Transdermal Place 2 patches onto the skin daily. Remove & Discard patch within 12 hours or as directed by MD    . LIDOCAINE 5 % EX OINT Topical Apply 1 application topically every hour as needed. For pain    . LOSARTAN POTASSIUM 100 MG PO TABS Oral Take 100 mg by mouth daily.    Marland Kitchen PREDNISONE 10 MG PO TABS Oral Take 50 mg by mouth daily. 50mg  for 3 weeks, then another taper will occur    . TRAMADOL HCL 50 MG PO TABS Oral Take 50-100 mg by mouth every 6 (six) hours as needed. For pain    . TRIAMCINOLONE ACETONIDE 0.1 % EX CREA Topical Apply 1 application topically 2 (two) times daily.      BP 172/90  Pulse 104  Temp 98 F (36.7 C) (Oral)  Resp 20  SpO2 98%  Physical Exam  Nursing note and vitals reviewed. Constitutional: She appears well-developed and well-nourished. No distress.  HENT:  Head: Normocephalic and atraumatic.  Neck: Neck supple.  Cardiovascular: Normal rate and regular rhythm.   Pulmonary/Chest: Effort normal and breath sounds normal. No respiratory distress. She has no wheezes. She has no rales.  Abdominal: Soft. She exhibits no distension and no mass. There is tenderness in the suprapubic area and left lower quadrant. There is no rebound, no guarding and no CVA tenderness.  Neurological: She is alert.  Skin: She is not diaphoretic.    ED Course  Procedures (including critical care time)  Labs Reviewed  CBC WITH DIFFERENTIAL - Abnormal; Notable for the following:    WBC 13.4 (*)     RBC 3.45 (*)     Hemoglobin 9.7 (*)     HCT 30.3 (*)      Platelets 452 (*)     Neutro Abs 9.8 (*)     All other components within normal limits  BASIC METABOLIC PANEL - Abnormal; Notable for the following:    Glucose, Bld 100 (*)     All other components within normal limits  URINALYSIS, ROUTINE W REFLEX MICROSCOPIC - Abnormal; Notable for the following:    Hgb urine dipstick MODERATE (*)     All other components within normal limits  URINE MICROSCOPIC-ADD ON - Abnormal; Notable for the following:    Squamous Epithelial / LPF FEW (*)  Bacteria, UA FEW (*)     All other components within normal limits   No results found.   No diagnosis found.    MDM  Pt with vasculitis on chronic steroids presenting with LLQ abdominal pain, no associated symptoms.  Labs significant for WBC 13.4 (though patient is on chronic steroids), Hgb 9.7 and platelets 452 which are not significantly changed from her baseline, and UA showing moderate hematuria.    Pt signed out to Dr Read Drivers pending CT abdomen pelvis.  If negative, pt to be reassessed, likely d/c home with prescription for percocet, PCP follow up.         [place], [place] 09/09/11 [phone]

## 2011-09-08 NOTE — ED Notes (Addendum)
Pt presents with multiple complaints states that she has not felt well all week was seen at [facility] for leg pain this week states that her BP has been elevated as well as pulse reports that she is also having low back pain generalized malaise and weakness and thought she may need her kidney function tested

## 2011-09-08 NOTE — ED Notes (Addendum)
Pt states that she has been out of her percocet since Wed. And has been taking tramadol which is like m&m's to her pt also continues to have bilat calf swelling and pain

## 2011-09-09 MED ORDER — OXYCODONE-ACETAMINOPHEN 5-325 MG PO TABS: 1.0000 | ORAL_TABLET | Freq: Four times a day (QID) | ORAL | Status: AC | PRN

## 2011-09-09 MED ORDER — IOHEXOL 300 MG/ML  SOLN
100.0000 mL | Freq: Once | INTRAMUSCULAR | Status: AC | PRN
  Administered 2011-09-09: 100 mL via INTRAVENOUS

## 2011-09-09 MED ORDER — FENTANYL CITRATE 0.05 MG/ML IJ SOLN
100.0000 ug | Freq: Once | INTRAMUSCULAR | Status: AC
  Administered 2011-09-09: 100 ug via INTRAVENOUS
  Filled 2011-09-09: qty 2

## 2011-09-09 MED ORDER — IOHEXOL 300 MG/ML  SOLN
20.0000 mL | INTRAMUSCULAR | Status: AC
  Administered 2011-09-08: 20 mL via ORAL

## 2011-09-09 NOTE — ED Notes (Signed)
Transported to CT, no change in condition

## 2011-09-09 NOTE — ED Provider Notes (Signed)
Nursing notes and vitals signs, including pulse oximetry, reviewed.  Summary of this visit's results, reviewed by myself:  Labs:  Results for orders placed during the hospital encounter of 09/08/11  CBC WITH DIFFERENTIAL      Component Value Range   WBC 13.4 (*) 4.0 - 10.5 K/uL   RBC 3.45 (*) 3.87 - 5.11 MIL/uL   Hemoglobin 9.7 (*) 12.0 - 15.0 g/dL   HCT 40.9 (*) 81.1 - 91.4 %   MCV 87.8  78.0 - 100.0 fL   MCH 28.1  26.0 - 34.0 pg   MCHC 32.0  30.0 - 36.0 g/dL   RDW 78.2  95.6 - 21.3 %   Platelets 452 (*) 150 - 400 K/uL   Neutrophils Relative 73  43 - 77 %   Lymphocytes Relative 20  12 - 46 %   Monocytes Relative 7  3 - 12 %   Eosinophils Relative 0  0 - 5 %   Basophils Relative 0  0 - 1 %   Neutro Abs 9.8 (*) 1.7 - 7.7 K/uL   Lymphs Abs 2.7  0.7 - 4.0 K/uL   Monocytes Absolute 0.9  0.1 - 1.0 K/uL   Eosinophils Absolute 0.0  0.0 - 0.7 K/uL   Basophils Absolute 0.0  0.0 - 0.1 K/uL   RBC Morphology POLYCHROMASIA PRESENT     Smear Review LARGE PLATELETS PRESENT    BASIC METABOLIC PANEL      Component Value Range   Sodium 137  135 - 145 mEq/L   Potassium 3.8  3.5 - 5.1 mEq/L   Chloride 99  96 - 112 mEq/L   CO2 29  19 - 32 mEq/L   Glucose, Bld 100 (*) 70 - 99 mg/dL   BUN 12  6 - 23 mg/dL   Creatinine, Ser 0.86  0.50 - 1.10 mg/dL   Calcium 9.2  8.4 - 57.8 mg/dL   GFR calc non Af Amer >90  >90 mL/min   GFR calc Af Amer >90  >90 mL/min  URINALYSIS, ROUTINE W REFLEX MICROSCOPIC      Component Value Range   Color, Urine YELLOW  YELLOW   APPearance CLEAR  CLEAR   Specific Gravity, Urine 1.013  1.005 - 1.030   pH 6.0  5.0 - 8.0   Glucose, UA NEGATIVE  NEGATIVE mg/dL   Hgb urine dipstick MODERATE (*) NEGATIVE   Bilirubin Urine NEGATIVE  NEGATIVE   Ketones, ur NEGATIVE  NEGATIVE mg/dL   Protein, ur NEGATIVE  NEGATIVE mg/dL   Urobilinogen, UA 0.2  0.0 - 1.0 mg/dL   Nitrite NEGATIVE  NEGATIVE   Leukocytes, UA NEGATIVE  NEGATIVE  URINE MICROSCOPIC-ADD ON      Component Value  Range   Squamous Epithelial / LPF FEW (*) RARE   WBC, UA 0-2  <3 WBC/hpf   RBC / HPF 0-2  <3 RBC/hpf   Bacteria, UA FEW (*) RARE    Imaging Studies: Ct Abdomen Pelvis W Contrast  09/09/2011  *RADIOLOGY REPORT*  Clinical Data: Left lower quadrant pain  CT ABDOMEN AND PELVIS WITH CONTRAST  Technique:  Multidetector CT imaging of the abdomen and pelvis was performed following the standard protocol during bolus administration of intravenous contrast.  Contrast: 20mL OMNIPAQUE IOHEXOL 300 MG/ML  SOLN, OMNIPAQUE IOHEXOL 300 MG/ML  SOLN  Comparison: 04/07/2009  Findings: Limited images through the lung bases demonstrate no significant appreciable abnormality. The heart size is within normal limits. No pleural or pericardial effusion.  Unremarkable liver,  biliary system, pancreas, and adrenal glands. There is a mixed cystic and calcified lesion within the spleen, unchanged and likely incidental.  Symmetric renal enhancement.  No hydronephrosis or hydroureter.  No bowel obstruction.  No CT evidence for colitis.  No free intraperitoneal air or fluid.  No lymphadenopathy.  Normal caliber vasculature  Circumferential bladder wall thickening is nonspecific given incomplete distension.  There is a dominant cyst within the left adnexa measuring 4.8 cm and measuring water attenuation.  Tubular structure anterior to this may represent a dilated fallopian tube or prominent gonadal vessels.  No free fluid within the pelvis.  Absent uterus.  No acute osseous finding.  IMPRESSION:  Left adnexal cyst measuring up to 4.8 cm. If premenopausal, almost certainly benign.  Adjacent tubular structure may reflect a dilated fallopian tube or prominent gonadal vessels.  If there is concern for acute gynecologic pathology such as ovarian torsion or TOA, ultrasound should be obtained.  Circumferential bladder wall thickening is nonspecific.  Correlate with urinalysis.  Original Report Authenticated By: Waneta Martins, M.D.     The patient will follow up with Dr. Audie Box, her OB/GYN. The patient has a history of torsion of the right ovary. She states that her current discomfort is nowhere near the severity or character of prior ovarian torsion.  Hanley Seamen, MD 09/09/11 0111

## 2011-09-09 NOTE — ED Notes (Signed)
Called into patients room.  Pt c/o pain to IV site and is worried that 'IV is wrong since my CT".  IV flushes well with no difficulty and pt does not c/o increase pain with flush.  Dressing CDI and no signs of wetness/leaking.  No redness or swelling noted.  Pt given choice to d/c current IV and have it restarted.  Pt declined at this time

## 2011-09-09 NOTE — ED Provider Notes (Signed)
Medical screening examination/treatment/procedure(s) were performed by non-physician practitioner and as supervising physician I was immediately available for consultation/collaboration.   Rolan Bucco, MD 09/09/11 1505

## 2011-09-10 ENCOUNTER — Ambulatory Visit (INDEPENDENT_AMBULATORY_CARE_PROVIDER_SITE_OTHER): Payer: BC Managed Care – PPO | Admitting: Gynecology

## 2011-09-10 ENCOUNTER — Encounter: Payer: Self-pay | Admitting: Gynecology

## 2011-09-10 DIAGNOSIS — R102 Pelvic and perineal pain: Secondary | ICD-10-CM

## 2011-09-10 DIAGNOSIS — A499 Bacterial infection, unspecified: Secondary | ICD-10-CM

## 2011-09-10 DIAGNOSIS — N83209 Unspecified ovarian cyst, unspecified side: Secondary | ICD-10-CM

## 2011-09-10 DIAGNOSIS — N949 Unspecified condition associated with female genital organs and menstrual cycle: Secondary | ICD-10-CM

## 2011-09-10 DIAGNOSIS — N760 Acute vaginitis: Secondary | ICD-10-CM

## 2011-09-10 LAB — URINALYSIS W MICROSCOPIC + REFLEX CULTURE
Crystals: NONE SEEN
Ketones, ur: NEGATIVE mg/dL
Nitrite: NEGATIVE
Protein, ur: NEGATIVE mg/dL
Specific Gravity, Urine: 1.02 (ref 1.005–1.030)
Urobilinogen, UA: 0.2 mg/dL (ref 0.0–1.0)

## 2011-09-10 LAB — WET PREP FOR TRICH, YEAST, CLUE: Yeast Wet Prep HPF POC: NONE SEEN

## 2011-09-10 MED ORDER — CLINDAMYCIN PHOSPHATE 2 % VA CREA: 1.0000 | TOPICAL_CREAM | Freq: Every day | VAGINAL | Status: AC

## 2011-09-10 NOTE — Progress Notes (Signed)
Patient presents complaining of abdominal pelvic pain. Went to the ER where a CT scan was done which showed a 4 cm left ovarian cyst and small probable hydro-salpinx. She is a long history of abdominopelvic pain status post separate right salpingo-oophorectomy and hysterectomy. Most recently diagnosed with vasculitis as evidenced by multiple ulcerative skin lesions. Also with fatigue and a number of other constitutional symptoms. Currently on a number medications to include the steroid narcotics gabapentin Valium.  Has appointment to see her physician at [facility] who is following her for her vasculitis tomorrow.  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 Abdomen soft nontender without masses guarding rebound organomegaly Pelvic external BUS vagina with thick white discharge. Bimanual with 4 cm palpable left adnexa mildly tender. Rectovaginal normal  Assessment and plan: Left ovarian cyst possible small hydrosalpinx. Plan observation at present with reultrasound in 2 months for resolution. Her more acute problem is to get control of her vasculitis which I think is probably contributing to her abdominal pain. She has appointment to see her physician at [facility] tomorrow we'll follow with them. She's already on narcotics I think no further pain medicine as needed at this point for the cyst other than follow up in 2 months for ultrasound to make sure that it resolves.

## 2011-09-10 NOTE — Patient Instructions (Signed)
Continue to follow up with your physician at [facility] in reference to the vasculitis. Follow up here in 2 months for reultrasound to follow up on the left ovarian cyst.

## 2011-09-12 LAB — URINE CULTURE: Colony Count: 30000

## 2012-01-29 ENCOUNTER — Emergency Department (HOSPITAL_BASED_OUTPATIENT_CLINIC_OR_DEPARTMENT_OTHER)
Admission: EM | Admit: 2012-01-29 | Discharge: 2012-01-29 | Disposition: A | Discharge status: Home / Self Care | Payer: Self-pay | Attending: Emergency Medicine | Admitting: Emergency Medicine

## 2012-01-29 ENCOUNTER — Encounter (HOSPITAL_BASED_OUTPATIENT_CLINIC_OR_DEPARTMENT_OTHER): Payer: Self-pay | Admitting: *Deleted

## 2012-01-29 DIAGNOSIS — I776 Arteritis, unspecified: Secondary | ICD-10-CM | POA: Insufficient documentation

## 2012-01-29 DIAGNOSIS — E049 Nontoxic goiter, unspecified: Secondary | ICD-10-CM | POA: Insufficient documentation

## 2012-01-29 DIAGNOSIS — M549 Dorsalgia, unspecified: Secondary | ICD-10-CM | POA: Insufficient documentation

## 2012-01-29 DIAGNOSIS — Z8739 Personal history of other diseases of the musculoskeletal system and connective tissue: Secondary | ICD-10-CM | POA: Insufficient documentation

## 2012-01-29 DIAGNOSIS — K219 Gastro-esophageal reflux disease without esophagitis: Secondary | ICD-10-CM | POA: Insufficient documentation

## 2012-01-29 DIAGNOSIS — Z8679 Personal history of other diseases of the circulatory system: Secondary | ICD-10-CM | POA: Insufficient documentation

## 2012-01-29 DIAGNOSIS — R5381 Other malaise: Secondary | ICD-10-CM | POA: Insufficient documentation

## 2012-01-29 DIAGNOSIS — Z8669 Personal history of other diseases of the nervous system and sense organs: Secondary | ICD-10-CM | POA: Insufficient documentation

## 2012-01-29 DIAGNOSIS — Z79899 Other long term (current) drug therapy: Secondary | ICD-10-CM | POA: Insufficient documentation

## 2012-01-29 DIAGNOSIS — G8929 Other chronic pain: Secondary | ICD-10-CM | POA: Insufficient documentation

## 2012-01-29 DIAGNOSIS — IMO0001 Reserved for inherently not codable concepts without codable children: Secondary | ICD-10-CM | POA: Insufficient documentation

## 2012-01-29 DIAGNOSIS — Z8742 Personal history of other diseases of the female genital tract: Secondary | ICD-10-CM | POA: Insufficient documentation

## 2012-01-29 DIAGNOSIS — I10 Essential (primary) hypertension: Secondary | ICD-10-CM | POA: Insufficient documentation

## 2012-01-29 DIAGNOSIS — R5383 Other fatigue: Secondary | ICD-10-CM | POA: Insufficient documentation

## 2012-01-29 LAB — BASIC METABOLIC PANEL
CO2: 24 mEq/L (ref 19–32)
Calcium: 9.4 mg/dL (ref 8.4–10.5)
Glucose, Bld: 104 mg/dL — ABNORMAL HIGH (ref 70–99)
Potassium: 3.7 mEq/L (ref 3.5–5.1)
Sodium: 136 mEq/L (ref 135–145)

## 2012-01-29 MED ORDER — OXYCODONE-ACETAMINOPHEN 5-325 MG PO TABS
2.0000 | ORAL_TABLET | Freq: Once | ORAL | Status: AC
  Administered 2012-01-29: 2 via ORAL
  Filled 2012-01-29 (×2): qty 2

## 2012-01-29 NOTE — ED Notes (Signed)
Pt requested rx pain med-states she has valium at home-EDP [facility] notified-states pt received percocet #90 on 12/17-pt notified EDP would not be giving pain med rx due to recent pecocet rx-pt also states she has rx for BP med at [facility] but has not picked it up-notified of importance of taking BP meds-pt states she has called daughter for ride home after being notified by Lynn Ito, charge RN that she was to call for ride due to percocet po given here

## 2012-01-29 NOTE — ED Provider Notes (Signed)
History     CSN: [id number]  Arrival date & time 01/29/12  1727   First MD Initiated Contact with Patient 01/29/12 2022      Chief Complaint  Patient presents with  . Back Pain    (Consider location/radiation/quality/duration/timing/severity/associated sxs/prior treatment) HPI Comments: Patient with a history of vasculitis with associated chronic low back pain presents with worsening back pain over the last 4 weeks. She states is her typical type pain but it seems to be getting worse. She has some tingling in her left buttocks but denies any numbness in her feet or weakness in her legs. She denies any loss of bowel or bladder function. She does complain that she's having muscle spasms throughout her body in different areas intermittently over the last 3-4 weeks. She denies any fevers or chills. She feels fatigued and has her generalized myalgias associated with a vasculitis. She currently sees a rheumatologist at [facility] she was previously on prednisone but currently is not taking prednisone. She also is prescribed ongoing Percocet from her physician at [facility] but states she ran out 2 days ago. She's been referred to a pain clinic around [facility] [place] [facility] but did not have an appointment until January 17. She also has an appointment with her rheumatologist on January 17.  Patient is a 41 y.o. female presenting with back pain.  Back Pain  Pertinent negatives include no chest pain, no fever, no numbness, no headaches, no abdominal pain and no weakness.    Past Medical History  Diagnosis Date  . Goiter   . Vasculitis limited to skin   . Vasculitis 03/2011  . Hypertension   . GERD (gastroesophageal reflux disease)   . Ulcer     from vasculitis  . Neuromuscular disorder   . Thoracic outlet syndrome 2004  . Fibromyalgia 12/2010  . Arthritis   . Pyodermic gangrenosum   . Raynaud's syndrome   . IC (interstitial cystitis)   . Sleep apnea   . Tachycardia     Past Surgical History  Procedure Date  .  Ovarian cyst removal   . Hemmorrhoidectomy   . Abdominal hysterectomy     TLH  . Pelvic laparoscopy 2007    RSO    Family History  Problem Relation Age of Onset  . Diabetes Mother   . Hypertension Mother   . Cancer Mother 75    melanoma of the eye with mets    History  Substance Use Topics  . Smoking status: Never Smoker   . Smokeless tobacco: Never Used  . Alcohol Use: No    OB History    Grav Para Term Preterm Abortions TAB SAB Ect Mult Living                  Review of Systems  Constitutional: Negative for fever, chills, diaphoresis and fatigue.  HENT: Negative for congestion, rhinorrhea and sneezing.   Eyes: Negative.   Respiratory: Negative for cough, chest tightness and shortness of breath.   Cardiovascular: Negative for chest pain and leg swelling.  Gastrointestinal: Negative for nausea, vomiting, abdominal pain, diarrhea and blood in stool.  Genitourinary: Negative for frequency, hematuria, flank pain and difficulty urinating.  Musculoskeletal: Positive for myalgias and back pain. Negative for arthralgias.       Muscle spasms  Skin: Negative for rash.  Neurological: Negative for dizziness, speech difficulty, weakness, numbness and headaches.    Allergies  Doxycycline and Tetracycline  Home Medications   Current Outpatient Rx  Name  Route  Sig  Dispense  Refill  . ACETAMINOPHEN 500 MG PO TABS   Oral   Take 1,000 mg by mouth every 6 (six) hours as needed. For pain         . ASPIRIN-CAFFEINE 500-32.5 MG PO TABS   Oral   Take 2 tablets by mouth once as needed. For pain         . BISMUTH SUBSALICYLATE 262 MG/15ML PO SUSP   Oral   Take 30 mLs by mouth once as needed. For stomach         . CALCIUM + D PO   Oral   Take 2 tablets by mouth daily.         Marland Kitchen DIAZEPAM 10 MG PO TABS   Oral   Take 10 mg by mouth at bedtime. For sleep         . DULOXETINE HCL 60 MG PO CPEP   Oral   Take 60 mg by mouth daily.         Marland Kitchen ESOMEPRAZOLE  MAGNESIUM 40 MG PO CPDR   Oral   Take 40 mg by mouth daily before breakfast.         . GABAPENTIN 300 MG PO CAPS   Oral   Take 300 mg by mouth daily.          Marland Kitchen LEVOTHYROXINE SODIUM 75 MCG PO TABS   Oral   Take 75 mcg by mouth daily.         Marland Kitchen LIDOCAINE 5 % EX PTCH   Transdermal   Place 2 patches onto the skin daily. Remove & Discard patch within 12 hours or as directed by MD         . LIDOCAINE 5 % EX OINT   Topical   Apply 1 application topically every hour as needed. For pain         . LOSARTAN POTASSIUM 100 MG PO TABS   Oral   Take 100 mg by mouth daily.         Marland Kitchen PREDNISONE 10 MG PO TABS   Oral   Take 50 mg by mouth daily. 50mg  for 3 weeks, then another taper will occur         . TRAMADOL HCL 50 MG PO TABS   Oral   Take 50-100 mg by mouth every 6 (six) hours as needed. For pain         . TRIAMCINOLONE ACETONIDE 0.1 % EX CREA   Topical   Apply 1 application topically 2 (two) times daily.           BP 161/100  Pulse 98  Temp 98.8 F (37.1 C) (Oral)  Resp 20  SpO2 98%  Physical Exam  Constitutional: She is oriented to person, place, and time. She appears well-developed and well-nourished.  HENT:  Head: Normocephalic and atraumatic.  Eyes: Pupils are equal, round, and reactive to light.  Neck: Normal range of motion. Neck supple.  Cardiovascular: Normal rate, regular rhythm and normal heart sounds.   Pulmonary/Chest: Effort normal and breath sounds normal. No respiratory distress. She has no wheezes. She has no rales. She exhibits no tenderness.  Abdominal: Soft. Bowel sounds are normal. There is no tenderness. There is no rebound and no guarding.  Musculoskeletal: Normal range of motion. She exhibits no edema.       TTP across lower lumbar region.  Neg SLR bilaterally. Pedal pulses intact  Lymphadenopathy:    She has no cervical adenopathy.  Neurological: She is alert  and oriented to person, place, and time. She has normal strength. No  sensory deficit. GCS eye subscore is 4. GCS verbal subscore is 5. GCS motor subscore is 6.  Skin: Skin is warm and dry. No rash noted.  Psychiatric: She has a normal mood and affect.    ED Course  Procedures (including critical care time)  Results for orders placed during the hospital encounter of 01/29/12  BASIC METABOLIC PANEL      Component Value Range   Sodium 136  135 - 145 mEq/L   Potassium 3.7  3.5 - 5.1 mEq/L   Chloride 97  96 - 112 mEq/L   CO2 24  19 - 32 mEq/L   Glucose, Bld 104 (*) 70 - 99 mg/dL   BUN 10  6 - 23 mg/dL   Creatinine, Ser 1.61  0.50 - 1.10 mg/dL   Calcium 9.4  8.4 - 09.6 mg/dL   GFR calc non Af Amer >90  >90 mL/min   GFR calc Af Amer >90  >90 mL/min   No results found.    1. Back pain       MDM  Patient presents with exacerbation of her chronic back pain. She has no neurologic deficits or suggestions of cauda equina. She complains of some intermittent muscle spasms to various parts of her body including her thumb, her legs, and her arms. This is been going on about 3-4 weeks. Her electrolytes are unremarkable. She was given a dose of Percocet here in the emergency room. I did not give her a prescription as she just got 90 10 mg oxycodone tablets filled on December 17. Advised her to followup with the rheumatologist as to his possible return here as needed for any worsening symptoms        Rolan Bucco, MD 01/29/12 2224

## 2012-01-29 NOTE — ED Notes (Signed)
Pt has been on percocet since August every 4 hours and has been out x 2 days

## 2012-01-29 NOTE — ED Notes (Signed)
Pt with lower back pain x 3-4 weeks ago pt state that she has not been able to control her movements. Pt out of her pain meds. Pt is also her for multiple other complaints including ovarian cyst, Gerd, muscle spasms,generalized weakness

## 2012-11-04 ENCOUNTER — Ambulatory Visit (INDEPENDENT_AMBULATORY_CARE_PROVIDER_SITE_OTHER): Payer: Managed Care, Other (non HMO) | Admitting: Gynecology

## 2012-11-04 ENCOUNTER — Encounter: Payer: Self-pay | Admitting: Gynecology

## 2012-11-04 DIAGNOSIS — N949 Unspecified condition associated with female genital organs and menstrual cycle: Secondary | ICD-10-CM

## 2012-11-04 DIAGNOSIS — R102 Pelvic and perineal pain: Secondary | ICD-10-CM

## 2012-11-04 DIAGNOSIS — N83209 Unspecified ovarian cyst, unspecified side: Secondary | ICD-10-CM

## 2012-11-04 LAB — URINALYSIS W MICROSCOPIC + REFLEX CULTURE
Bilirubin Urine: NEGATIVE
Glucose, UA: NEGATIVE mg/dL
Specific Gravity, Urine: 1.02 (ref 1.005–1.030)

## 2012-11-04 NOTE — Progress Notes (Addendum)
Patient presents complaining of pelvic pain. More left-sided although also radiates to the right side. Also with some lower back pain. History of abdominal hysterectomy and right salpingo-oophorectomy in the past. Has had issues with chronic intermittent pelvic pain for years. Was seen 08/2011 after an ER evaluation with CT scan showed a left ovarian 4 cm cyst. She was to return in 2 months for ultrasound but never followed up for this. Currently being treated for vasculitis. Also notes chronic constipation history. No frequency dysuria urgency or other urinary symptoms.  Exam with Selena Batten Assistant Abdomen soft nontender without masses guarding rebound organomegaly. Pelvic external BUS vagina normal. Bimanual without gross masses. Mild pelvic tenderness noted. Rectovaginal exam is normal.  Assessment and plan: Long history of chronic pelvic pain status post TLH and separate RSO. CT scan one year ago showed 4 cm cyst. Patient did not followup for ultrasound is recommended. Recommend ultrasound now and patient will schedule. Patient also has full GYN exam scheduled in one month and she will followup for this. She notes that she has some thyroid nodules that she was supposed to followup on with another physician but never did have encouraged her to also do this.  Addendum: Urinalysis was checked today and negative.

## 2012-11-04 NOTE — Patient Instructions (Signed)
Follow up for ultrasound as scheduled 

## 2012-11-07 ENCOUNTER — Encounter: Payer: Self-pay | Admitting: Gynecology

## 2012-11-07 ENCOUNTER — Ambulatory Visit (INDEPENDENT_AMBULATORY_CARE_PROVIDER_SITE_OTHER): Payer: Managed Care, Other (non HMO)

## 2012-11-07 ENCOUNTER — Ambulatory Visit (INDEPENDENT_AMBULATORY_CARE_PROVIDER_SITE_OTHER): Payer: Managed Care, Other (non HMO) | Admitting: Gynecology

## 2012-11-07 DIAGNOSIS — R1032 Left lower quadrant pain: Secondary | ICD-10-CM

## 2012-11-07 DIAGNOSIS — N83209 Unspecified ovarian cyst, unspecified side: Secondary | ICD-10-CM

## 2012-11-07 DIAGNOSIS — R141 Gas pain: Secondary | ICD-10-CM

## 2012-11-07 DIAGNOSIS — R102 Pelvic and perineal pain: Secondary | ICD-10-CM

## 2012-11-07 DIAGNOSIS — N949 Unspecified condition associated with female genital organs and menstrual cycle: Secondary | ICD-10-CM

## 2012-11-07 NOTE — Progress Notes (Signed)
Patient presents for ultrasound. History of abdominopelvic pain. Status post TLH RSO in the past. History of fibromyalgia and vasculitis. Had CT scan last year that showed 4 cm left ovarian cyst and questionable hydrosalpinx. Never followed up for initial ultrasound.  Ultrasound today shows left ovary with thin-walled slightly vascular cyst 24 mm. No free fluid. Right adnexa and vaginal cuff negative.  Assessment and plan: Long history of abdominopelvic pain. Small left ovarian cyst. Currently on methadone for her vasculitis pain. Weaning herself off of Cymbalta. Recommend observation at present with a repeat ultrasound in 3 months. Possibilities to include non-gynecologic source of pain versus adhesions or due to ovarian cyst although unlikely. Followup sooner if significant change in symptomatology. Importance to repeat ultrasound in 3 months stressed.

## 2012-11-07 NOTE — Patient Instructions (Signed)
Follow up for ultrasound in 3 months 

## 2012-12-01 ENCOUNTER — Encounter: Payer: Self-pay | Admitting: Gynecology

## 2012-12-18 ENCOUNTER — Encounter: Payer: Self-pay | Admitting: Gynecology

## 2013-05-31 ENCOUNTER — Emergency Department (HOSPITAL_COMMUNITY)
Admission: EM | Admit: 2013-05-31 | Discharge: 2013-05-31 | Disposition: A | Discharge status: Home / Self Care | Payer: Managed Care, Other (non HMO) | Attending: Emergency Medicine | Admitting: Emergency Medicine

## 2013-05-31 ENCOUNTER — Emergency Department (HOSPITAL_COMMUNITY): Payer: Managed Care, Other (non HMO)

## 2013-05-31 ENCOUNTER — Encounter (HOSPITAL_COMMUNITY): Payer: Self-pay | Admitting: Emergency Medicine

## 2013-05-31 DIAGNOSIS — Z8669 Personal history of other diseases of the nervous system and sense organs: Secondary | ICD-10-CM | POA: Insufficient documentation

## 2013-05-31 DIAGNOSIS — R42 Dizziness and giddiness: Secondary | ICD-10-CM | POA: Insufficient documentation

## 2013-05-31 DIAGNOSIS — M129 Arthropathy, unspecified: Secondary | ICD-10-CM | POA: Insufficient documentation

## 2013-05-31 DIAGNOSIS — Z872 Personal history of diseases of the skin and subcutaneous tissue: Secondary | ICD-10-CM | POA: Insufficient documentation

## 2013-05-31 DIAGNOSIS — K219 Gastro-esophageal reflux disease without esophagitis: Secondary | ICD-10-CM | POA: Insufficient documentation

## 2013-05-31 DIAGNOSIS — Z791 Long term (current) use of non-steroidal anti-inflammatories (NSAID): Secondary | ICD-10-CM | POA: Insufficient documentation

## 2013-05-31 DIAGNOSIS — Z79899 Other long term (current) drug therapy: Secondary | ICD-10-CM | POA: Insufficient documentation

## 2013-05-31 DIAGNOSIS — R51 Headache: Secondary | ICD-10-CM | POA: Insufficient documentation

## 2013-05-31 DIAGNOSIS — Z87448 Personal history of other diseases of urinary system: Secondary | ICD-10-CM | POA: Insufficient documentation

## 2013-05-31 DIAGNOSIS — R110 Nausea: Secondary | ICD-10-CM | POA: Insufficient documentation

## 2013-05-31 DIAGNOSIS — IMO0001 Reserved for inherently not codable concepts without codable children: Secondary | ICD-10-CM | POA: Insufficient documentation

## 2013-05-31 DIAGNOSIS — Z7982 Long term (current) use of aspirin: Secondary | ICD-10-CM | POA: Insufficient documentation

## 2013-05-31 DIAGNOSIS — I10 Essential (primary) hypertension: Secondary | ICD-10-CM | POA: Insufficient documentation

## 2013-05-31 DIAGNOSIS — E049 Nontoxic goiter, unspecified: Secondary | ICD-10-CM | POA: Insufficient documentation

## 2013-05-31 DIAGNOSIS — R52 Pain, unspecified: Secondary | ICD-10-CM | POA: Insufficient documentation

## 2013-05-31 DIAGNOSIS — Z792 Long term (current) use of antibiotics: Secondary | ICD-10-CM | POA: Insufficient documentation

## 2013-05-31 DIAGNOSIS — R519 Headache, unspecified: Secondary | ICD-10-CM

## 2013-05-31 DIAGNOSIS — G8929 Other chronic pain: Secondary | ICD-10-CM | POA: Insufficient documentation

## 2013-05-31 HISTORY — DX: Headache, unspecified: R51.9

## 2013-05-31 HISTORY — DX: Pain in right knee: M25.561

## 2013-05-31 HISTORY — DX: Other chronic pain: G89.29

## 2013-05-31 HISTORY — DX: Headache: R51

## 2013-05-31 HISTORY — DX: Dizziness and giddiness: R42

## 2013-05-31 HISTORY — DX: Pain in left knee: M25.562

## 2013-05-31 HISTORY — DX: Other specified disorders of bone, shoulder: M89.8X1

## 2013-05-31 HISTORY — DX: Pain in left arm: M79.602

## 2013-05-31 HISTORY — DX: Cervicalgia: M54.2

## 2013-05-31 LAB — BASIC METABOLIC PANEL
BUN: 12 mg/dL (ref 6–23)
CO2: 23 mEq/L (ref 19–32)
Calcium: 9.2 mg/dL (ref 8.4–10.5)
Chloride: 98 mEq/L (ref 96–112)
Creatinine, Ser: 0.6 mg/dL (ref 0.50–1.10)
GFR calc Af Amer: >90 mL/min (ref >90)
Glucose, Bld: 86 mg/dL (ref 70–99)
POTASSIUM: 3.6 meq/L — AB (ref 3.7–5.3)
SODIUM: 137 meq/L (ref 137–147)

## 2013-05-31 LAB — CBC WITH DIFFERENTIAL/PLATELET
Basophils Absolute: 0 10*3/uL (ref 0.0–0.1)
Basophils Relative: 1 % (ref 0–1)
EOS ABS: 0.1 10*3/uL (ref 0.0–0.7)
Eosinophils Relative: 1 % (ref 0–5)
HCT: 37 % (ref 36.0–46.0)
Hemoglobin: 12.1 g/dL (ref 12.0–15.0)
LYMPHS ABS: 1.5 10*3/uL (ref 0.7–4.0)
Lymphocytes Relative: 27 % (ref 12–46)
MCH: 27.9 pg (ref 26.0–34.0)
MCHC: 32.7 g/dL (ref 30.0–36.0)
MCV: 85.5 fL (ref 78.0–100.0)
Monocytes Absolute: 0.5 10*3/uL (ref 0.1–1.0)
Monocytes Relative: 10 % (ref 3–12)
NEUTROS PCT: 61 % (ref 43–77)
Neutro Abs: 3.4 10*3/uL (ref 1.7–7.7)
PLATELETS: 339 10*3/uL (ref 150–400)
RBC: 4.33 MIL/uL (ref 3.87–5.11)
RDW: 14.1 % (ref 11.5–15.5)
WBC: 5.5 10*3/uL (ref 4.0–10.5)

## 2013-05-31 MED ORDER — KETOROLAC TROMETHAMINE 30 MG/ML IJ SOLN
30.0000 mg | Freq: Once | INTRAMUSCULAR | Status: AC
  Administered 2013-05-31: 30 mg via INTRAVENOUS
  Filled 2013-05-31: qty 1

## 2013-05-31 MED ORDER — DIPHENHYDRAMINE HCL 50 MG/ML IJ SOLN
50.0000 mg | Freq: Once | INTRAMUSCULAR | Status: AC
  Administered 2013-05-31: 50 mg via INTRAVENOUS
  Filled 2013-05-31: qty 1

## 2013-05-31 MED ORDER — METOCLOPRAMIDE HCL 5 MG/ML IJ SOLN
10.0000 mg | Freq: Once | INTRAMUSCULAR | Status: AC
  Administered 2013-05-31: 10 mg via INTRAVENOUS
  Filled 2013-05-31: qty 2

## 2013-05-31 MED ORDER — SODIUM CHLORIDE 0.9 % IV BOLUS (SEPSIS)
500.0000 mL | Freq: Once | INTRAVENOUS | Status: AC
  Administered 2013-05-31: 500 mL via INTRAVENOUS

## 2013-05-31 MED ORDER — PROMETHAZINE HCL 25 MG PO TABS: 25.0000 mg | ORAL_TABLET | Freq: Four times a day (QID) | ORAL | Status: DC | PRN

## 2013-05-31 NOTE — ED Notes (Signed)
Patient transported to CT 

## 2013-05-31 NOTE — ED Notes (Addendum)
Pt reports h/a 1.5 weeks, is worse when she gets in the car. Reports dizziness when leaning over and in the car, also nausea but no vomiting. Pt is a x 4. States bp has been high and is taking BP meds.There are no neuro deficits noted. Is taking methadone.

## 2013-05-31 NOTE — ED Provider Notes (Signed)
CSN: [id number]     Arrival date & time 05/31/13  1618 History   First MD Initiated Contact with Patient 05/31/13 1756     Chief Complaint  Patient presents with  . Headache      HPI Pt was seen at 1825. Per pt, c/o gradual onset and persistence of constant acute flair of her chronic headache for the past 1.5 weeks. Has been associated with nausea and "dizziness."  Describes the headache as per her usual chronic headache pain pattern for the past several years.  Denies headache was sudden or maximal in onset or at any time.  Denies visual changes, no focal motor weakness, no tingling/numbness in extremities, no fevers, no neck pain, no rash.      Past Medical History  Diagnosis Date  . Goiter   . Vasculitis limited to skin   . Vasculitis 03/2011  . Hypertension   . GERD (gastroesophageal reflux disease)   . Ulcer     from vasculitis  . Neuromuscular disorder   . Thoracic outlet syndrome 2004  . Fibromyalgia 12/2010  . Arthritis   . Pyodermic gangrenosum   . Raynaud's syndrome   . IC (interstitial cystitis)   . Sleep apnea   . Tachycardia   . Chronic neck pain   . Chronic pain of left upper extremity   . Chronic scapular pain   . Chronic headache   . Dizziness   . Bilateral chronic knee pain    Past Surgical History  Procedure Laterality Date  . Ovarian cyst removal    . Hemmorrhoidectomy    . Abdominal hysterectomy      TLH  . Pelvic laparoscopy  2007    RSO   Family History  Problem Relation Age of Onset  . Diabetes Mother   . Hypertension Mother   . Cancer Mother 15    melanoma of the eye with mets   History  Substance Use Topics  . Smoking status: Never Smoker   . Smokeless tobacco: Never Used  . Alcohol Use: No   OB History   Grav Para Term Preterm Abortions TAB SAB Ect Mult Living   3 2 2  0 1 0 1 0 0 2     Review of Systems ROS: Statement: All systems negative except as marked or noted in the HPI; Constitutional: Negative for fever and chills. ; ;  Eyes: Negative for eye pain, redness and discharge. ; ; ENMT: Negative for ear pain, hoarseness, nasal congestion, sinus pressure and sore throat. ; ; Cardiovascular: Negative for chest pain, palpitations, diaphoresis, dyspnea and peripheral edema. ; ; Respiratory: Negative for cough, wheezing and stridor. ; ; Gastrointestinal: +nausea. Negative for vomiting, diarrhea, abdominal pain, blood in stool, hematemesis, jaundice and rectal bleeding. . ; ; Genitourinary: Negative for dysuria, flank pain and hematuria. ; ; Musculoskeletal: Negative for back pain and neck pain. Negative for swelling and trauma.; ; Skin: Negative for pruritus, rash, abrasions, blisters, bruising and skin lesion.; ; Neuro: +headache, "dizziness." Negative for lightheadedness and neck stiffness. Negative for weakness, altered level of consciousness , altered mental status, extremity weakness, paresthesias, involuntary movement, seizure and syncope.      Allergies  Doxycycline and Tetracycline  Home Medications   Prior to Admission medications   Medication Sig Start Date End Date Taking? Authorizing Provider  acetaminophen (TYLENOL) 500 MG tablet Take 1,000 mg by mouth every 8 (eight) hours as needed for moderate pain. For pain   Yes Historical Provider, MD  Aspirin-Caffeine (BAYER BACK &  BODY PAIN EX ST) 500-32.5 MG TABS Take 1 tablet by mouth 2 (two) times daily. For pain   Yes Historical Provider, MD  esomeprazole (NEXIUM) 20 MG capsule Take 20 mg by mouth daily at 12 noon.   Yes Historical Provider, MD  ibuprofen (ADVIL,MOTRIN) 800 MG tablet Take 800 mg by mouth every 8 (eight) hours as needed.   Yes Historical Provider, MD  levothyroxine (SYNTHROID, LEVOTHROID) 75 MCG tablet Take 75 mcg by mouth daily.   Yes Jacelyn Pi, MD  liothyronine (CYTOMEL) 5 MCG tablet Take 5 mcg by mouth daily with lunch.   Yes Historical Provider, MD  losartan (COZAAR) 100 MG tablet Take 100 mg by mouth daily.   Yes Historical Provider, MD   methadone (DOLOPHINE) 5 MG tablet Take 7.5 mg by mouth 4 (four) times daily.   Yes Historical Provider, MD  neomycin-bacitracin-polymyxin (NEOSPORIN) OINT Apply 1 application topically as needed for irritation or wound care.   Yes Historical Provider, MD  traMADol (ULTRAM) 50 MG tablet Take 50 mg by mouth every 6 (six) hours as needed for moderate pain. For pain   Yes Historical Provider, MD   BP 118/75  Pulse 73  Temp(Src) 98.3 F (36.8 C) (Oral)  Resp 18  SpO2 100% Physical Exam 1830: Physical examination:  Nursing notes reviewed; Vital signs and O2 SAT reviewed;  Constitutional: Well developed, Well nourished, Well hydrated, In no acute distress; Head:  Normocephalic, atraumatic; Eyes: EOMI, PERRL, No scleral icterus; ENMT: TM's clear bilat. +edemetous nasal turbinates bilat with clear rhinorrhea. Mouth and pharynx without lesions. No tonsillar exudates. No intra-oral edema. No submandibular or sublingual edema. No hoarse voice, no drooling, no stridor. No pain with manipulation of larynx. No trismus. Mouth and pharynx normal, Mucous membranes moist; Neck: Supple, Full range of motion, No lymphadenopathy; Cardiovascular: Regular rate and rhythm, No murmur, rub, or gallop; Respiratory: Breath sounds clear & equal bilaterally, No rales, rhonchi, wheezes.  Speaking full sentences with ease, Normal respiratory effort/excursion; Chest: Nontender, Movement normal; Abdomen: Soft, Nontender, Nondistended, Normal bowel sounds; Genitourinary: No CVA tenderness; Extremities: Pulses normal, No tenderness, No edema, No calf edema or asymmetry.; Neuro: AA&Ox3, Major CN grossly intact.Speech clear.  No facial droop.  No nystagmus. Grips equal. Strength 5/5 equal bilat UE's and LE's.  DTR 2/4 equal bilat UE's and LE's.  No gross sensory deficits.  Normal cerebellar testing bilat UE's (finger-nose) and LE's (heel-shin)..; Skin: Color normal, Warm, Dry.   ED Course  Procedures     EKG Interpretation None       MDM  MDM Reviewed: previous chart, nursing note and vitals Reviewed previous: MRI and labs Interpretation: labs and CT scan   Results for orders placed during the hospital encounter of 05/31/13  CBC WITH DIFFERENTIAL      Result Value Ref Range   WBC 5.5  4.0 - 10.5 K/uL   RBC 4.33  3.87 - 5.11 MIL/uL   Hemoglobin 12.1  12.0 - 15.0 g/dL   HCT 37.0  36.0 - 46.0 %   MCV 85.5  78.0 - 100.0 fL   MCH 27.9  26.0 - 34.0 pg   MCHC 32.7  30.0 - 36.0 g/dL   RDW 14.1  11.5 - 15.5 %   Platelets 339  150 - 400 K/uL   Neutrophils Relative % 61  43 - 77 %   Neutro Abs 3.4  1.7 - 7.7 K/uL   Lymphocytes Relative 27  12 - 46 %   Lymphs Abs 1.5  0.7 - 4.0 K/uL   Monocytes Relative 10  3 - 12 %   Monocytes Absolute 0.5  0.1 - 1.0 K/uL   Eosinophils Relative 1  0 - 5 %   Eosinophils Absolute 0.1  0.0 - 0.7 K/uL   Basophils Relative 1  0 - 1 %   Basophils Absolute 0.0  0.0 - 0.1 K/uL  BASIC METABOLIC PANEL      Result Value Ref Range   Sodium 137  137 - 147 mEq/L   Potassium 3.6 (*) 3.7 - 5.3 mEq/L   Chloride 98  96 - 112 mEq/L   CO2 23  19 - 32 mEq/L   Glucose, Bld 86  70 - 99 mg/dL   BUN 12  6 - 23 mg/dL   Creatinine, Ser 0.60  0.50 - 1.10 mg/dL   Calcium 9.2  8.4 - 10.5 mg/dL   GFR calc non Af Amer >90  >90 mL/min   GFR calc Af Amer >90  >90 mL/min   Ct Head Wo Contrast 05/31/2013   CLINICAL DATA:  Headache for the past week.  EXAM: CT HEAD WITHOUT CONTRAST  TECHNIQUE: Contiguous axial images were obtained from the base of the skull through the vertex without intravenous contrast.  COMPARISON:  03/20/2007.  FINDINGS: Normal appearing cerebral hemispheres and posterior fossa structures. Normal size and position of the ventricles. No intracranial hemorrhage, mass lesion or CT evidence of acute infarction. Unremarkable bones and included paranasal sinuses.  IMPRESSION: Normal examination.   Electronically Signed   By: Enrique Sack M.D.   On: 05/31/2013 19:37    2055:  Pt feels improved after  meds and wants to go home now. Will continue to tx symptomatically at this time. Dx and testing d/w pt and family.  Questions answered.  Verb understanding, agreeable to d/c home with outpt f/u.   Alfonzo Feller, DO 06/02/13 [phone]

## 2013-05-31 NOTE — Discharge Instructions (Signed)
°Emergency Department Resource Guide °1) Find a Doctor and Pay Out of Pocket °Although you won't have to find out who is covered by your insurance plan, it is a good idea to ask around and get recommendations. You will then need to call the office and see if the doctor you have chosen will accept you as a new patient and what types of options they offer for patients who are self-pay. Some doctors offer discounts or will set up payment plans for their patients who do not have insurance, but you will need to ask so you aren't surprised when you get to your appointment. ° °2) Contact Your Local Health Department °Not all health departments have doctors that can see patients for sick visits, but many do, so it is worth a call to see if yours does. If you don't know where your local health department is, you can check in your phone book. The CDC also has a tool to help you locate your state's health department, and many state websites also have listings of all of their local health departments. ° °3) Find a Walk-in Clinic °If your illness is not likely to be very severe or complicated, you may want to try a walk in clinic. These are popping up all over the country in pharmacies, drugstores, and shopping centers. They're usually staffed by nurse practitioners or physician assistants that have been trained to treat common illnesses and complaints. They're usually fairly quick and inexpensive. However, if you have serious medical issues or chronic medical problems, these are probably not your best option. ° °No Primary Care Doctor: °- Call [facility] at  832-8000 - they can help you locate a primary care doctor that  accepts your insurance, provides certain services, etc. °- Physician Referral Service- 1-800-533-3463 ° °Chronic Pain Problems: °Organization         Address  Phone   Notes  °[facility]  ([phone]) [phone] Patients need to be referred by their primary care doctor.  ° °Medication  Assistance: °Organization         Address  Phone   Notes  °[place] [facility] [address] °[place], [place] [address] (336) 641-8030 --Must be a resident of [place] °-- Must have NO insurance coverage whatsoever (no Medicaid/ Medicare, etc.) °-- The pt. MUST have a primary care doctor that directs their care regularly and follows them in the community °  °[facility]  (866) 331-1348   °[employer]  (888) 892-1162   ° °Agencies that provide inexpensive medical care: °Organization         Address  Phone   Notes  °[facility]  (336) 832-8035   °[facility]    (336) 832-7272   °[facility] [address] °[place], [place] [address] (336) 832-4777   °[facility] [address], °[place] (336) 271-4999   °[facility]    (336) 373-0678   °[facility]    (336) 272-1050   °[facility] ° [address], [place] Phone:  (336) 832-4444, Fax:  (336) 832-4440 Hours of Operation:  9 am - 6 pm, M-F.  Also accepts Medicaid/Medicare and self-pay.  °[facility] ° [address], [place] Phone: (336) [phone], Fax: (336) [phone]. Hours of Operation:  8:30 am - 5:30 pm, M-F.  Also accepts Medicaid and self-pay.  °[facility] [place] [address], [place] Phone: (336) 878-6027   °[facility] [address], [place], [place] (336)723-1848, Ext. 123 Mondays & Thursdays: 7-9 AM.  First 15 patients are seen on a first come, first serve basis. °  ° °Medicaid-accepting [place] Providers: ° °Organization         Address  Phone   Notes  °[facility] [address], [place] (336) 641-2100 Also accepts self-pay patients.  °[facility] [address], [place] ° (336) 856-9996   °[facility] [address], [place]  (336) 288-8857   °[facility] [address] [place] [address], [place] (336) 299-7000   °Veita Bland [address], [place]  ° (336) 373-1557 Only accepts [place] Access Medicaid patients after they have their name applied to their card.  ° °Self-Pay (no insurance) in [place]: ° °Organization         Address  Phone   Notes  °Sickle Cell Patients, [facility] [address], [place] (336) 832-1970   °[facility] [address], [place] (336) 832-4400   °[facility] ° [address], [place] (336) 992-4800   °[facility]/Dr. Osei-Bonsu ° [address] [place] [address], [place] or [address], [place] (336) [phone] Phone number for both [place] and [place] locations is the same.  °[facility] [address], [place] (336) [phone]   °[facility] [address] [place] [address], [place] or [address] (336) 852-7530 °(336) 878-2260   °[facility] [address], [place] (336) 350-1642, phone; (336) 294-5005, fax Sees patients 1st and 3rd Saturday of every month.  Must not qualify for public or private insurance (i.e. Medicaid, Medicare, [place] Health Choice, Veterans' Benefits) • Household income should be no more than 200% of the poverty level •The clinic cannot treat you if you are pregnant or think you are pregnant • Sexually transmitted diseases are not treated at the clinic.  ° ° °Dental Care: °Organization         Address  Phone  Notes  °[place] [facility] [address], [place] (336) 641-6152 Accepts children up to age 21 who are enrolled in Medicaid or [place] Health Choice; pregnant women with a Medicaid card; and children who have applied for Medicaid or [place] Health Choice, but were declined, whose parents can pay a reduced fee at time of service.  °[place]  [facility] [place]  [address], [place] (336) 641-7733 Accepts children up to age 21 who are enrolled in Medicaid or [place] Health Choice; pregnant women with a Medicaid card; and children who have applied for Medicaid or [place] Health Choice, but were declined, whose parents can pay a reduced fee at time of service.  °[facility] ° [address], [place] (336) 641-4533 Patients are seen by appointment only. Walk-ins are not accepted. [facility] will see patients 18 years of age and older. °Monday - Tuesday (8am-5pm) °Most Wednesdays (8:30-5pm) °$30 per visit, cash only  °[facility] ° [address], [place] (336) [phone] Patients are seen by appointment only. Walk-ins are not accepted. [facility] will see patients 18 years of age and older. °One   Wednesday Evening (Monthly: Volunteer Based).  $30 per visit, cash only  °[facility]  (919) [phone] for adults; Children under age 4, call Graduate Pediatric Dentistry at ([phone]) [phone]. Children aged 4-14, please call ([phone]) [phone] to request a pediatric application. ° Dental services are provided in all areas of dental care including fillings, crowns and bridges, complete and partial dentures, implants, gum treatment, root canals, and extractions. Preventive care is also provided. Treatment is provided to both adults and children. °Patients are selected via a lottery and there is often a waiting list. °  °[facility] [address], °[place] ° (336) 763-8833 [email or web address] °  °[facility] [address], [place], [place] (336)723-1848, Ext. 123 Second and Fourth Thursday of each month, opens at 6:30 AM; Clinic ends at 9 AM.  Patients are seen on a first-come first-served basis, and a limited number are seen during each clinic.  ° °[facility] ° [address], [place], [place] (336) [phone]    Eligibility Requirements °You must have lived in [place], [place], or [place] counties for at least the last three months. °  You cannot be eligible for state or federal sponsored healthcare insurance, including Veterans Administration, Medicaid, or Medicare. °  You generally cannot be eligible for healthcare insurance through your employer.  °  How to apply: °Eligibility screenings are held every Tuesday and Wednesday afternoon from 1:00 pm until 4:00 pm. You do not need an appointment for the interview!  °[facility] [address], [place], [place] 336-631-2330   °[facility]  336-342-8273   °[facility]  336-703-3100   °[facility]  336-570-6415   ° °Behavioral Health Resources in the Community: °Intensive Outpatient Programs °Organization         Address  Phone  Notes  °[facility] [address], [place], [place] 336-878-6098   °[facility] [address], [place], [place] 336-832-9800   °[facility] [address], [place], [place] ° 336-882-2125   °[facility] [address],  °[place], [place] 1-800-853-5163 or 336-641-4981   °Substance Abuse Resources °Organization         Address  Phone  Notes  °[facility]  336-882-2125   °[facility]  336-784-9470   °[facility]  336-285-9073   °[facility]  336-845-3988   °Residential & Outpatient Substance Abuse Program  1-800-659-3381   °Psychological Services °Organization         Address  Phone  Notes  °[facility]  336- 832-9600   °[facility]  336- 378-7881   °[facility] [address], [place] 1-800-853-5163 or 336-641-4981   ° °Mobile Crisis Teams °Organization         Address  Phone  Notes  °[facility], Mobile Crisis Care Unit  1-877-626-1772   °Assertive °[facility] ° [address]  [place], [place] 336-834-9664   °Sharon DeEsch [address] °[place] 336-554-5454   ° °Self-Help/Support Groups °Organization         Address  Phone             Notes  °[facility]  - variety of support groups  336- 373-1402 Call for more information  °Narcotics Anonymous (NA), [facility] [address], °[place]  2 meetings at this location  ° °  Residential Treatment Programs Organization         Address  Phone  Notes  [facility] [address],    [place]  1-(657)115-2544   [facility]  [address], [place] [address], [place], [place]   [place], [place] [phone] Admissions: 8am-3pm M-F  [facility] [address]    [place], [place] 765-465-0354   [facility] [address] [place], [place], [place]   [facility] [address],  [place], [place]   [facility] Intensive Outpatient [address], [place], [place]   [facility] ([place] [place]  [place], [place] 1-(754) 747-4911 or 4256153989   [facility] [address], [place], [place] Accepts Medicaid  [facility] [address]  [place] 1-850-764-0768 Substance Abuse/Addiction Treatment   [facility]         Address  Phone  Notes  [facility]  208-548-3488   Domenic Schwab, PhD [address] [place], [place]   (501)786-3289 or 507-382-1762   [facility] [place], [place] (510)161-2351   [facility] [address], [place], [place] 912-868-2214 Insurance/Medicaid/sponsorship through [facility] Families [address], [address] [place]                                    [place], [place] [phone] [place] [address], [place] ([phone]    Dr. Adele Schilder  (204)[phone]   [facility] Dept. 1) [address], [place] 2) [place] 3)  [address], [place] [phone] [phone]  [phone]   [place] 706-626-1191 or [phone] (After Hours)      Take over the counter tylenol and ibuprofen (OR over the counter excedrin), and benadryl, as directed on packaging, with the prescription given to you today, as needed for headache.  Keep a headache diary, as discussed.  Call your regular medical doctor tomorrow to schedule a follow up appointment within the next 2 days.  Return to the Emergency Department immediately sooner if worsening.

## 2013-10-29 ENCOUNTER — Encounter: Payer: Self-pay | Admitting: Gastroenterology

## 2013-11-30 ENCOUNTER — Encounter (HOSPITAL_COMMUNITY): Payer: Self-pay | Admitting: Emergency Medicine

## 2014-06-04 ENCOUNTER — Other Ambulatory Visit: Payer: Self-pay | Admitting: Endocrinology

## 2014-06-04 DIAGNOSIS — E049 Nontoxic goiter, unspecified: Secondary | ICD-10-CM

## 2014-06-07 ENCOUNTER — Ambulatory Visit
Admission: RE | Admit: 2014-06-07 | Discharge: 2014-06-07 | Disposition: A | Discharge status: Home / Self Care | Payer: Managed Care, Other (non HMO) | Source: Ambulatory Visit | Attending: Endocrinology | Admitting: Endocrinology

## 2014-06-07 DIAGNOSIS — E049 Nontoxic goiter, unspecified: Secondary | ICD-10-CM

## 2014-10-29 ENCOUNTER — Telehealth: Payer: Self-pay | Admitting: *Deleted

## 2014-10-29 NOTE — Telephone Encounter (Signed)
Pt called request name of therapist I gave her Marya Amsler and # to call. Pt also ? UTI will have front desk contact pt.

## 2014-11-02 ENCOUNTER — Ambulatory Visit (INDEPENDENT_AMBULATORY_CARE_PROVIDER_SITE_OTHER): Payer: Managed Care, Other (non HMO) | Admitting: Licensed Clinical Social Worker

## 2014-11-02 ENCOUNTER — Encounter: Payer: Self-pay | Admitting: Women's Health

## 2014-11-02 ENCOUNTER — Ambulatory Visit (INDEPENDENT_AMBULATORY_CARE_PROVIDER_SITE_OTHER): Payer: Managed Care, Other (non HMO) | Admitting: Women's Health

## 2014-11-02 VITALS — BP 130/80

## 2014-11-02 DIAGNOSIS — R300 Dysuria: Secondary | ICD-10-CM

## 2014-11-02 DIAGNOSIS — F4323 Adjustment disorder with mixed anxiety and depressed mood: Secondary | ICD-10-CM | POA: Diagnosis not present

## 2014-11-02 LAB — URINALYSIS W MICROSCOPIC + REFLEX CULTURE
Bilirubin Urine: NEGATIVE
Casts: NONE SEEN [LPF]
Crystals: NONE SEEN [HPF]
Glucose, UA: NEGATIVE
Ketones, ur: NEGATIVE
LEUKOCYTES UA: NEGATIVE
NITRITE: NEGATIVE
PH: 7 (ref 5.0–8.0)
Protein, ur: NEGATIVE
SPECIFIC GRAVITY, URINE: 1.015 (ref 1.001–1.035)
Yeast: NONE SEEN [HPF]

## 2014-11-02 MED ORDER — PHENAZOPYRIDINE HCL 100 MG PO TABS: 100.0000 mg | ORAL_TABLET | Freq: Three times a day (TID) | ORAL | Status: DC | PRN

## 2014-11-02 MED ORDER — IBUPROFEN 600 MG PO TABS: 600.0000 mg | ORAL_TABLET | Freq: Three times a day (TID) | ORAL | Status: DC | PRN

## 2014-11-02 NOTE — Patient Instructions (Signed)
Overactive Bladder The bladder has two functions that are totally opposite of the other. One is to relax and stretch out so it can store urine (fills like a balloon), and the other is to contract and squeeze down so that it can empty the urine that it has stored. Proper functioning of the bladder is a complex mixing of these two functions. The filling and emptying of the bladder can be influenced by:  The bladder.  The spinal cord.  The brain.  The nerves going to the bladder.  Other organs that are closely related to the bladder such as prostate in males and the vagina in females. As your bladder fills with urine, nerve signals are sent from the bladder to the brain to tell you that you may need to urinate. Normal urination requires that the bladder squeeze down with sufficient strength to empty the bladder, but this also requires that the bladder squeeze down sufficiently long to finish the job. In addition the sphincter muscles, which normally keep you from leaking urine, must also relax so that the urine can pass. Coordination between the bladder muscle squeezing down and the sphincter muscles relaxing is required to make everything happen normally. With an overactive bladder sometimes the muscles of the bladder contract unexpectedly and involuntarily and this causes an urgent need to urinate. The normal response is to try to hold urine in by contracting the sphincter muscles. Sometimes the bladder contracts so strongly that the sphincter muscles cannot stop the urine from passing out and incontinence occurs. This kind of incontinence is called urge incontinence. Having an overactive bladder can be embarrassing and awkward. It can keep you from living life the way you want to. Many people think it is just something you have to put up with as you grow older or have certain health conditions. In fact, there are treatments that can help make your life easier and more pleasant. CAUSES  Many things  can cause an overactive bladder. Possibilities include:  Urinary tract infection or infection of nearby tissues such as the prostate.  Prostate enlargement.  In women, multiple pregnancies or surgery on the uterus or urethra.  Bladder stones, inflammation, or tumors.  Caffeine.  Alcohol.  Medications. For example, diuretics (drugs that help the body get rid of extra fluid) increase urine production. Some other medicines must be taken with lots of fluids.  Muscle or nerve weakness. This might be the result of a spinal cord injury, a stroke, multiple sclerosis, or Parkinson disease.  Diabetes can cause a high urine volume which fills the bladder so quickly that the normal urge to urinate is triggered very strongly. SYMPTOMS   Loss of bladder control. You feel the need to urinate and cannot make your body wait.  Sudden, strong urges to urinate.  Urinating 8 or more times a day.  Waking up to urinate two or more times a night. DIAGNOSIS  To decide if you have overactive bladder, your health care provider will probably:  Ask about symptoms you have noticed.  Ask about your overall health. This will include questions about any medications you are taking.  Do a physical examination. This will help determine if there are obvious blockages or other problems.  Order some tests. These might include:  A blood test to check for diabetes or other health issues that could be contributing to the problem.  Urine testing. This could measure the flow of urine and the pressure on the bladder.  A test of your neurological   system (the brain, spinal cord, and nerves). This is the system that senses the need to urinate. Some of these tests are called flow tests, bladder pressure tests, and electrical measurements of the sphincter muscle.  A bladder test to check whether it is emptying completely when you urinate.  Cystoscopy. This test uses a thin tube with a tiny camera on it. It offers a  look inside your urethra and bladder to see if there are problems.  Imaging tests. You might be given a contrast dye and then asked to urinate. X-rays are taken to see how your bladder is working. TREATMENT  An overactive bladder can be treated in many ways. The treatment will depend on the cause. Whether you have a mild or severe case also makes a difference. Often, treatment can be given in your health care provider's office or clinic. Be sure to discuss the different options with your caregiver. They include:  Behavioral treatments. These do not involve medication or surgery:  Bladder training. For this, you would follow a schedule to urinate at regular intervals. This helps you learn to control the urge to urinate. At first, you might be asked to wait a few minutes after feeling the urge. In time, you should be able to schedule bathroom visits an hour or more apart.  Kegel exercises. These exercises strengthen the pelvic floor muscles, which support the bladder. Toning these muscles can help control urination even if the bladder muscles are overactive. A specialist will teach you how to do these exercises correctly. They will require daily practice.  Weight loss. If you are obese or overweight, losing weight might stop your bladder from being overactive. Talk to your health care provider about how many pounds you should lose. Also ask if there is a specific program or method that would work best for you.  Diet change. This might be suggested if constipation is making your overactive bladder worse. Your health care provider or a nutritionist can explain ways to change what you eat to ease constipation. Other people might need to take in less caffeine or alcohol. Sometimes drinking fewer fluids is needed, too.  Protection. This is not an actual treatment. But, you could wear special pads to take care of any leakage while you wait for other treatments to take effect. This will help you avoid  embarrassment.  Physical treatments.  Electrical stimulation. Electrodes will send gentle pulses to the nerves or muscles that help control the bladder. The goal is to strengthen them. Sometimes this is done with the electrodes outside the body. Or, they might be placed inside the body (implanted). This treatment can take several months to have an effect.  Medications. These are usually used along with other treatments. Several medicines are available. Some are injected into the muscles involved in urination. Others come in pill form. Medications sometimes prescribed include:  Anticholinergics. These drugs block the signals that the nerves deliver to the bladder. This keeps it from releasing urine at the wrong time. Researchers think the drugs might help in other ways, too.  Imipramine. This is an antidepressant. But, it relaxes bladder muscles.  Botox. This is still experimental. Some people believe that injecting it into the bladder muscles will relax them so they work more normally. It has also been injected into the sphincter muscle when the sphincter muscle does not open properly. This is a temporary fix, however. Also, it might make matters worse, especially in older people.  Surgery.  A device might be implanted   to help manage your nerves. It works on the nerves that signal when you need to urinate.  Surgery is sometimes needed with electrical stimulation. If the electrodes are implanted, this is done through surgery.  Sometimes repairs need to be made through surgery. For example, the size of the bladder can be changed. This is usually done in severe cases only. HOME CARE INSTRUCTIONS   Take any medications your health care provider prescribed or suggested. Follow the directions carefully.  Practice any lifestyle changes that are recommended. These might include:  Drinking less fluid or drinking at different times of the day. If you need to urinate often during the night, for  example, you may need to stop drinking fluids early in the evening.  Cutting down on caffeine or alcohol. They can both make an overactive bladder worse. Caffeine is found in coffee, tea, and sodas.  Doing Kegel exercises to strengthen muscles.  Losing weight, if that is recommended.  Eating a healthy and balanced diet. This will help you avoid constipation.  Keep a journal or a log. You might be asked to record how much you drink and when, and also when you feel the need to urinate.  Learn how to care for implants or other devices, such as pessaries. SEEK MEDICAL CARE IF:   Your overactive bladder gets worse.  You feel increased pain or irritation when you urinate.  You notice blood in your urine.  You have questions about any medications or devices that your health care provider recommended.  You notice blood, pus, or swelling at the site of any test or treatment procedure.  You have an oral temperature above 102F (38.9C). SEEK IMMEDIATE MEDICAL CARE IF:  You have an oral temperature above 102F (38.9C), not controlled by medicine. Document Released: 11/11/2008 Document Revised: 06/01/2013 Document Reviewed: 11/11/2008 [employer] Patient Information 2015 [employer]. This information is not intended to replace advice given to you by your health care provider. Make sure you discuss any questions you have with your health care provider.  

## 2014-11-02 NOTE — Progress Notes (Signed)
Patient ID: [name], female   DOB: Jun 07, 1970, 44 y.o.   MRN: [id number] Presents with a 6 wk h/o urinary frequency, urgency,  back pain, and discomfort from bladder fullness.  Has taken 2 courses of macrobid from her PCP, completed 2 wks ago with no relief.  Has been urinating more than hourly during the day >8oz each time, with 1-2 times nocturia.  Denies fever, dysuria, and nausea vomiting.  H/o vasculitis, fibromyalgia, hypothyroidism, OSA, thoracic outlet syndrome, raynaud's syndrome.  Hysterectomy with unilateral oophorectomy.    Numerous labs at primary care- 10/29/14: Serum Cr- 0.69, Na- 137, K- 4.5, Sed-rate- 2  negative RA, ANA panel  Exam:  Numerous healed scars covering arms, legs from vasculitis per patient. Mild  tenderness of the low back/sacral, no CVAT, no abdominal tenderness.  External genitalia normal.  Speculum exam reveals non erythematous vaginal walls, and minimal discharge.  Bimanual exam nontender, no adnexal fullness. UA:  Blood +1, NIT: negative, Leu: negative, WBC 0-5, RBC 3-10, Bacteria Few  Polyuria- possible overactive bladder  Plan: Urine culture pending.  Prescribe Pyridium 100mg  TID, prn and Motrin 600mg  Q6hrs prn.  Reassured that this does not appear to be a UTI and does not appear to be of GYN origin.  Keep urology appointment in November, would like rheumatology referral we'll get scheduled.

## 2014-11-03 ENCOUNTER — Telehealth: Payer: Self-pay | Admitting: *Deleted

## 2014-11-03 NOTE — Telephone Encounter (Signed)
[name] sent a staff message for me to schedule new patient appointment to see Dr. Estanislado Pandy for history of fibromyalgia, arthritis. I called Dr.Deveshwar office and was informed that best if PCP refer patient as they would have documentation along with history for this diagnose. I called and spoke with patient about this as well she is going to try to get PCP office to take care of this for her.

## 2014-11-04 ENCOUNTER — Encounter: Payer: Self-pay | Admitting: Women's Health

## 2014-11-04 LAB — URINE CULTURE
Colony Count: NO GROWTH
ORGANISM ID, BACTERIA: NO GROWTH

## 2014-11-10 ENCOUNTER — Ambulatory Visit (INDEPENDENT_AMBULATORY_CARE_PROVIDER_SITE_OTHER): Payer: Managed Care, Other (non HMO) | Admitting: Licensed Clinical Social Worker

## 2014-11-10 DIAGNOSIS — F4323 Adjustment disorder with mixed anxiety and depressed mood: Secondary | ICD-10-CM

## 2014-11-18 ENCOUNTER — Ambulatory Visit: Payer: Managed Care, Other (non HMO) | Admitting: Licensed Clinical Social Worker

## 2014-11-24 ENCOUNTER — Telehealth: Payer: Self-pay

## 2014-11-24 ENCOUNTER — Ambulatory Visit (INDEPENDENT_AMBULATORY_CARE_PROVIDER_SITE_OTHER): Payer: Managed Care, Other (non HMO) | Admitting: Psychology

## 2014-11-24 DIAGNOSIS — F900 Attention-deficit hyperactivity disorder, predominantly inattentive type: Secondary | ICD-10-CM

## 2014-11-24 DIAGNOSIS — F4323 Adjustment disorder with mixed anxiety and depressed mood: Secondary | ICD-10-CM

## 2014-11-24 NOTE — Telephone Encounter (Signed)
I do not have a specific physician I'm recommending as most people that I have worked with are getting older and around retirement age.  I have been suggesting that patient's contact [place] location that's most convenient 2 them and make an appointment with one of the newer providers that will be around for a while.

## 2014-11-24 NOTE — Telephone Encounter (Signed)
Patient informed. 

## 2014-11-24 NOTE — Telephone Encounter (Signed)
Patient said you recently gave her referral to Marya Amsler and she really likes her.  Recently the patient's family MD has closed practice and she needs recommendation for a good family PCP for her whole family to use. She said she trusted you and wanted you to recommend someone. She takes ADHD meds and needs help from PCP with that.

## 2014-11-30 ENCOUNTER — Ambulatory Visit (INDEPENDENT_AMBULATORY_CARE_PROVIDER_SITE_OTHER): Payer: Managed Care, Other (non HMO) | Admitting: Licensed Clinical Social Worker

## 2014-11-30 DIAGNOSIS — F4323 Adjustment disorder with mixed anxiety and depressed mood: Secondary | ICD-10-CM

## 2014-12-01 ENCOUNTER — Ambulatory Visit (INDEPENDENT_AMBULATORY_CARE_PROVIDER_SITE_OTHER): Payer: Managed Care, Other (non HMO) | Admitting: Family Medicine

## 2014-12-01 ENCOUNTER — Encounter: Payer: Self-pay | Admitting: Family Medicine

## 2014-12-01 VITALS — BP 133/82 | HR 76 | Temp 98.2°F | Resp 20 | Ht 66.0 in | Wt 155.5 lb

## 2014-12-01 DIAGNOSIS — Z0000 Encounter for general adult medical examination without abnormal findings: Secondary | ICD-10-CM

## 2014-12-01 DIAGNOSIS — Z86010 Personal history of colonic polyps: Secondary | ICD-10-CM

## 2014-12-01 DIAGNOSIS — Z862 Personal history of diseases of the blood and blood-forming organs and certain disorders involving the immune mechanism: Secondary | ICD-10-CM

## 2014-12-01 DIAGNOSIS — Z23 Encounter for immunization: Secondary | ICD-10-CM | POA: Diagnosis not present

## 2014-12-01 DIAGNOSIS — E039 Hypothyroidism, unspecified: Secondary | ICD-10-CM | POA: Diagnosis not present

## 2014-12-01 DIAGNOSIS — Z13220 Encounter for screening for lipoid disorders: Secondary | ICD-10-CM | POA: Diagnosis not present

## 2014-12-01 DIAGNOSIS — M159 Polyosteoarthritis, unspecified: Secondary | ICD-10-CM

## 2014-12-01 LAB — CBC WITH DIFFERENTIAL/PLATELET
Basophils Absolute: 0 10*3/uL (ref 0.0–0.1)
Basophils Relative: 0.4 % (ref 0.0–3.0)
Eosinophils Absolute: 0.1 10*3/uL (ref 0.0–0.7)
Eosinophils Relative: 2.6 % (ref 0.0–5.0)
HEMATOCRIT: 40.4 % (ref 36.0–46.0)
HEMOGLOBIN: 13.2 g/dL (ref 12.0–15.0)
Lymphocytes Relative: 26 % (ref 12.0–46.0)
Lymphs Abs: 1.2 10*3/uL (ref 0.7–4.0)
MCHC: 32.7 g/dL (ref 30.0–36.0)
MCV: 92 fl (ref 78.0–100.0)
MONO ABS: 0.4 10*3/uL (ref 0.1–1.0)
Monocytes Relative: 8.6 % (ref 3.0–12.0)
Neutro Abs: 2.8 10*3/uL (ref 1.4–7.7)
Neutrophils Relative %: 62.4 % (ref 43.0–77.0)
Platelets: 284 10*3/uL (ref 150.0–400.0)
RBC: 4.39 Mil/uL (ref 3.87–5.11)
RDW: 13.8 % (ref 11.5–15.5)
WBC: 4.5 10*3/uL (ref 4.0–10.5)

## 2014-12-01 LAB — COMPREHENSIVE METABOLIC PANEL
ALBUMIN: 4.1 g/dL (ref 3.5–5.2)
ALK PHOS: 49 U/L (ref 39–117)
ALT: 12 U/L (ref 0–35)
AST: 13 U/L (ref 0–37)
BUN: 14 mg/dL (ref 6–23)
CO2: 30 mEq/L (ref 19–32)
Calcium: 9.5 mg/dL (ref 8.4–10.5)
Chloride: 102 mEq/L (ref 96–112)
Creatinine, Ser: 0.58 mg/dL (ref 0.40–1.20)
GFR: 118.64 mL/min (ref >60.00)
Glucose, Bld: 91 mg/dL (ref 70–99)
POTASSIUM: 4.3 meq/L (ref 3.5–5.1)
Sodium: 139 mEq/L (ref 135–145)
TOTAL PROTEIN: 7 g/dL (ref 6.0–8.3)
Total Bilirubin: 0.3 mg/dL (ref 0.2–1.2)

## 2014-12-01 LAB — LIPID PANEL
CHOLESTEROL: 167 mg/dL (ref 0–200)
HDL: 71.4 mg/dL (ref >39.00)
LDL Cholesterol: 85 mg/dL (ref 0–99)
NonHDL: 95.32
TRIGLYCERIDES: 53 mg/dL (ref 0.0–149.0)
Total CHOL/HDL Ratio: 2
VLDL: 10.6 mg/dL (ref 0.0–40.0)

## 2014-12-01 LAB — TSH: TSH: 1.21 u[IU]/mL (ref 0.35–4.50)

## 2014-12-01 NOTE — Patient Instructions (Signed)
Health Maintenance, Female Adopting a healthy lifestyle and getting preventive care can go a long way to promote health and wellness. Talk with your health care provider about what schedule of regular examinations is right for you. This is a good chance for you to check in with your provider about disease prevention and staying healthy. In between checkups, there are plenty of things you can do on your own. Experts have done a lot of research about which lifestyle changes and preventive measures are most likely to keep you healthy. Ask your health care provider for more information. WEIGHT AND DIET  Eat a healthy diet  Be sure to include plenty of vegetables, fruits, low-fat dairy products, and lean protein.  Do not eat a lot of foods high in solid fats, added sugars, or salt.  Get regular exercise. This is one of the most important things you can do for your health.  Most adults should exercise for at least 150 minutes each week. The exercise should increase your heart rate and make you sweat (moderate-intensity exercise).  Most adults should also do strengthening exercises at least twice a week. This is in addition to the moderate-intensity exercise.  Maintain a healthy weight  Body mass index (BMI) is a measurement that can be used to identify possible weight problems. It estimates body fat based on height and weight. Your health care provider can help determine your BMI and help you achieve or maintain a healthy weight.  For females 20 years of age and older:   A BMI below 18.5 is considered underweight.  A BMI of 18.5 to 24.9 is normal.  A BMI of 25 to 29.9 is considered overweight.  A BMI of 30 and above is considered obese.  Watch levels of cholesterol and blood lipids  You should start having your blood tested for lipids and cholesterol at 46 years of age, then have this test every 5 years.  You may need to have your cholesterol levels checked more often if:  Your lipid  or cholesterol levels are high.  You are older than 46 years of age.  You are at high risk for heart disease.  CANCER SCREENING   Lung Cancer  Lung cancer screening is recommended for adults 55-80 years old who are at high risk for lung cancer because of a history of smoking.  A yearly low-dose CT scan of the lungs is recommended for people who:  Currently smoke.  Have quit within the past 15 years.  Have at least a 30-pack-year history of smoking. A pack year is smoking an average of one pack of cigarettes a day for 1 year.  Yearly screening should continue until it has been 15 years since you quit.  Yearly screening should stop if you develop a health problem that would prevent you from having lung cancer treatment.  Breast Cancer  Practice breast self-awareness. This means understanding how your breasts normally appear and feel.  It also means doing regular breast self-exams. Let your health care provider know about any changes, no matter how small.  If you are in your 20s or 30s, you should have a clinical breast exam (CBE) by a health care provider every 1-3 years as part of a regular health exam.  If you are 40 or older, have a CBE every year. Also consider having a breast X-ray (mammogram) every year.  If you have a family history of breast cancer, talk to your health care provider about genetic screening.  If you   are at high risk for breast cancer, talk to your health care provider about having an MRI and a mammogram every year.  Breast cancer gene (BRCA) assessment is recommended for women who have family members with BRCA-related cancers. BRCA-related cancers include:  Breast.  Ovarian.  Tubal.  Peritoneal cancers.  Results of the assessment will determine the need for genetic counseling and BRCA1 and BRCA2 testing. Cervical Cancer Your health care provider may recommend that you be screened regularly for cancer of the pelvic organs (ovaries, uterus, and  vagina). This screening involves a pelvic examination, including checking for microscopic changes to the surface of your cervix (Pap test). You may be encouraged to have this screening done every 3 years, beginning at age 21.  For women ages 30-65, health care providers may recommend pelvic exams and Pap testing every 3 years, or they may recommend the Pap and pelvic exam, combined with testing for human papilloma virus (HPV), every 5 years. Some types of HPV increase your risk of cervical cancer. Testing for HPV may also be done on women of any age with unclear Pap test results.  Other health care providers may not recommend any screening for nonpregnant women who are considered low risk for pelvic cancer and who do not have symptoms. Ask your health care provider if a screening pelvic exam is right for you.  If you have had past treatment for cervical cancer or a condition that could lead to cancer, you need Pap tests and screening for cancer for at least 20 years after your treatment. If Pap tests have been discontinued, your risk factors (such as having a new sexual partner) need to be reassessed to determine if screening should resume. Some women have medical problems that increase the chance of getting cervical cancer. In these cases, your health care provider may recommend more frequent screening and Pap tests. Colorectal Cancer  This type of cancer can be detected and often prevented.  Routine colorectal cancer screening usually begins at 46 years of age and continues through 46 years of age.  Your health care provider may recommend screening at an earlier age if you have risk factors for colon cancer.  Your health care provider may also recommend using home test kits to check for hidden blood in the stool.  A small camera at the end of a tube can be used to examine your colon directly (sigmoidoscopy or colonoscopy). This is done to check for the earliest forms of colorectal  cancer.  Routine screening usually begins at age 50.  Direct examination of the colon should be repeated every 5-10 years through 46 years of age. However, you may need to be screened more often if early forms of precancerous polyps or small growths are found. Skin Cancer  Check your skin from head to toe regularly.  Tell your health care provider about any new moles or changes in moles, especially if there is a change in a mole's shape or color.  Also tell your health care provider if you have a mole that is larger than the size of a pencil eraser.  Always use sunscreen. Apply sunscreen liberally and repeatedly throughout the day.  Protect yourself by wearing long sleeves, pants, a wide-brimmed hat, and sunglasses whenever you are outside. HEART DISEASE, DIABETES, AND HIGH BLOOD PRESSURE   High blood pressure causes heart disease and increases the risk of stroke. High blood pressure is more likely to develop in:  People who have blood pressure in the high end   of the normal range (130-139/85-89 mm Hg).  People who are overweight or obese.  People who are African American.  If you are 38-23 years of age, have your blood pressure checked every 3-5 years. If you are 61 years of age or older, have your blood pressure checked every year. You should have your blood pressure measured twice--once when you are at a hospital or clinic, and once when you are not at a hospital or clinic. Record the average of the two measurements. To check your blood pressure when you are not at a hospital or clinic, you can use:  An automated blood pressure machine at a pharmacy.  A home blood pressure monitor.  If you are between 45 years and 39 years old, ask your health care provider if you should take aspirin to prevent strokes.  Have regular diabetes screenings. This involves taking a blood sample to check your fasting blood sugar level.  If you are at a normal weight and have a low risk for diabetes,  have this test once every three years after 46 years of age.  If you are overweight and have a high risk for diabetes, consider being tested at a younger age or more often. PREVENTING INFECTION  Hepatitis B  If you have a higher risk for hepatitis B, you should be screened for this virus. You are considered at high risk for hepatitis B if:  You were born in a country where hepatitis B is common. Ask your health care provider which countries are considered high risk.  Your parents were born in a high-risk country, and you have not been immunized against hepatitis B (hepatitis B vaccine).  You have HIV or AIDS.  You use needles to inject street drugs.  You live with someone who has hepatitis B.  You have had sex with someone who has hepatitis B.  You get hemodialysis treatment.  You take certain medicines for conditions, including cancer, organ transplantation, and autoimmune conditions. Hepatitis C  Blood testing is recommended for:  Everyone born from [birth year] through 1967.  Anyone with known risk factors for hepatitis C. Sexually transmitted infections (STIs)  You should be screened for sexually transmitted infections (STIs) including gonorrhea and chlamydia if:  You are sexually active and are younger than 46 years of age.  You are older than 46 years of age and your health care provider tells you that you are at risk for this type of infection.  Your sexual activity has changed since you were last screened and you are at an increased risk for chlamydia or gonorrhea. Ask your health care provider if you are at risk.  If you do not have HIV, but are at risk, it may be recommended that you take a prescription medicine daily to prevent HIV infection. This is called pre-exposure prophylaxis (PrEP). You are considered at risk if:  You are sexually active and do not regularly use condoms or know the HIV status of your partner(s).  You take drugs by injection.  You are sexually  active with a partner who has HIV. Talk with your health care provider about whether you are at high risk of being infected with HIV. If you choose to begin PrEP, you should first be tested for HIV. You should then be tested every 3 months for as long as you are taking PrEP.  PREGNANCY   If you are premenopausal and you may become pregnant, ask your health care provider about preconception counseling.  If you may  become pregnant, take 400 to 800 micrograms (mcg) of folic acid every day.  If you want to prevent pregnancy, talk to your health care provider about birth control (contraception). OSTEOPOROSIS AND MENOPAUSE   Osteoporosis is a disease in which the bones lose minerals and strength with aging. This can result in serious bone fractures. Your risk for osteoporosis can be identified using a bone density scan.  If you are 61 years of age or older, or if you are at risk for osteoporosis and fractures, ask your health care provider if you should be screened.  Ask your health care provider whether you should take a calcium or vitamin D supplement to lower your risk for osteoporosis.  Menopause may have certain physical symptoms and risks.  Hormone replacement therapy may reduce some of these symptoms and risks. Talk to your health care provider about whether hormone replacement therapy is right for you.  HOME CARE INSTRUCTIONS   Schedule regular health, dental, and eye exams.  Stay current with your immunizations.   Do not use any tobacco products including cigarettes, chewing tobacco, or electronic cigarettes.  If you are pregnant, do not drink alcohol.  If you are breastfeeding, limit how much and how often you drink alcohol.  Limit alcohol intake to no more than 1 drink per day for nonpregnant women. One drink equals 12 ounces of beer, 5 ounces of wine, or 1 ounces of hard liquor.  Do not use street drugs.  Do not share needles.  Ask your health care provider for help if  you need support or information about quitting drugs.  Tell your health care provider if you often feel depressed.  Tell your health care provider if you have ever been abused or do not feel safe at home.   This information is not intended to replace advice given to you by your health care provider. Make sure you discuss any questions you have with your health care provider.   Document Released: 07/31/2010 Document Revised: 02/05/2014 Document Reviewed: 12/17/2012 Elsevier Interactive Patient Education [employer].

## 2014-12-01 NOTE — Progress Notes (Signed)
Subjective:    Patient ID: [name], female    DOB: 13-Apr-1970, 44 y.o.   MRN: [id number]  HPI   Patient presents for new patient establishment with multiple complaints and request. All past medical history, surgical history, allergies, family history, immunizations and social history was obtained from the patient today and entered into the electronic medical record. Records are requested from her prior PCP, and will be reviewed at the time they are received. All medical records will be updated at that time.  Osteoarthritis: Patient reports history of osteoarthritis and fibromyalgia. Patient states she used to see [facility], and to her provider moved she was then referred to a new rheumatologist,  that she felt was inappropriate and she states she has a complaint against board against him, so she stopped seeing him. Patient goes to [facility] and is currently on methadone 10 mg 4 times a day. In addition she takes Tylenol or Advil as needed. She is asking for a new rheumatology referral today. She reports bilateral hands with arthritis, hand bilateral knees are starting to be more painful. Patient also endorses left ankle discomfort. She denies any erythema, fever or joint swelling.  ADD: Patient states she saw a doctor within [facility] our system she believes with psychology, and had ADD testing. She states he was a Engineer, water, and therefore doesn't prescribe medications. She states that she was told that her primary care provider could prescribe medications. There are no records of her "testing". She states her psychologist called and talked to a provider. Of note, nobody has called our office to discuss her case.  History of colon polyps: Patient states that she has a history of colon polyps. She reports having a colonoscopy completed her 5 years ago, but she doesn't recall who performed a colonoscopy. She reports a family history of colon polyps, but no colon cancer. Dr. Ardis Hughs  has performed an EGD in the past, and she thinks he also performed a colonoscopy. Patient reports no bowel changes, no melena, no hematochezia. She states the first colonoscopy was completed secondary to pain and bowel changes.  History thyroid nodule: Patient has had a history of right thyroid nodules, and left thyroid cysts. Last imaging study 06/07/2014, with stable appearance.  Hypothyroid: Patient follows with endocrine, Dr. Redmond Pulling and is on Synthroid 75 g and Cytomel 5 g. She states that she follows up with him regularly, last seen in April.   Health maintenance:  Colonoscopy:Indicated Mammogram: Indicated. Ordered today Cervical cancer screening; no indication for Pap smears, patient has gynecologist to perform routine pelvic exams.  Immunizations: unknown, records have been requested. Infectious disease screening: HIV indicated  Patient has received psychiatric counseling in the last 5 years, patient is on a chronic narcotic, patient has been seen by pain specialist.   Past Medical History  Diagnosis Date  . Goiter   . Vasculitis limited to skin   . Vasculitis ([place]) 03/2011  . GERD (gastroesophageal reflux disease)   . Ulcer     from vasculitis  . Neuromuscular disorder ([place])   . Thoracic outlet syndrome 2004  . Fibromyalgia 12/2010  . Pyodermic gangrenosum   . Raynaud's syndrome   . IC (interstitial cystitis)   . Sleep apnea   . Tachycardia   . Chronic neck pain   . Chronic pain of left upper extremity   . Chronic scapular pain   . Chronic headache   . Dizziness   . Bilateral chronic knee pain   .  Sleep apnea   . Colon polyps   . Anemia     iron supplement added at the time  . Arthritis     Osteoarthritis (knees, hands)  . Hypertension     with pain, once pain controlled no meds needed.   Allergies  Allergen Reactions  . Doxycycline Other (See Comments)    Hole in esophagus  . Tetracycline Other (See Comments)    Told not to take it since it's similar to  Doxycylcine   Past Surgical History  Procedure Laterality Date  . Ovarian cyst removal    . Hemmorrhoidectomy    . Abdominal hysterectomy      TLH  . Pelvic laparoscopy  2007    RSO   Family History  Problem Relation Age of Onset  . Diabetes Mother   . Hypertension Mother   . Cancer Mother 67    melanoma of the eye with mets   Social History   Social History  . Marital Status: Married    Spouse Name: N/A  . Number of Children: N/A  . Years of Education: N/A   Occupational History  . Not on file.   Social History Main Topics  . Smoking status: Never Smoker   . Smokeless tobacco: Never Used  . Alcohol Use: No  . Drug Use: No  . Sexual Activity: No   Other Topics Concern  . Not on file   Social History Narrative   Married, not sexually active. 9th grade education. [employer]   Lives with husband, 2 daughters and granddaughter.    Patient denies tobacco use.   She denies alcohol or drug use. Patient drinks caffeinated beverages. She takes a daily vitamin.   Patient wears her seatbelt. Patient has dentures.   There is a smoke alarm in her home. There are a gun in her home, and a locked case. He feels safe in her relationship.          Review of Systems Negative, with the exception of above mentioned in HPI    Objective:   Physical Exam BP 133/82 mmHg  Pulse 76  Temp(Src) 98.2 F (36.8 C) (Oral)  Resp 20  Ht $R'5\' 6"'qQ$  (1.676 m)  Wt 155 lb 8 oz (70.534 kg)  BMI 25.11 kg/m2  SpO2 99% Gen: Afebrile. No acute distress. Nontoxic in appearance, well-developed, well-nourished, Caucasian female. HENT: AT. [place]. Bilateral TM visualized and normal in appearance. MMM. Bilateral nares without erythema or swelling. Throat without erythema or exudates. No cough or hoarseness on exam Eyes:Pupils Equal Round Reactive to light, Extraocular movements intact,  Conjunctiva without redness, discharge or icterus. Neck/lymp/endocrine: Supple, no lymphadenopathy, right> Left  thyromegaly CV:  RRR  no edema, +2/4 P posterior tibialis pulses Chest: CTAB, no wheeze or crackles Abd: Soft. Flat. NTND. BS present. No Masses palpated.  Skin: No rashes, purpura or petechiae.  Multiple scar formations over face, neck, hands and arms (picking) Neuro: Normal gait. PERLA. EOMi. Alert. Oriented x3  Psych: Normal affect, dress and demeanor. Normal speech. Normal thought content and judgment.    Assessment & Plan:  1. Need for prophylactic vaccination and inoculation against influenza - Flu Vaccine QUAD 36+ mos PF IM (Fluarix & Fluzone Quad PF)  2. History of anemia - CBC w/Diff  3. Hypothyroidism, unspecified hypothyroidism type - Patient to continue seeing endocrine for medications. - TSH - Comp Met (CMET)  4. Screening cholesterol level - Lipid panel  5. Healthcare maintenance Health maintenance:  Colonoscopy: Gastroenterology referral placed today  for colonoscopy with patient's reporting personal history of colon polyp, and overdue colonoscopy follow-up. No family history of colon cancer. Family history of colon polyps. Mammogram: Indicated. Ordered today. No family history. Cervical cancer screening; no indication for Pap smears, patient has gynecologist to perform routine pelvic exams.  Immunizations: unknown, records have been requested. Flu administered Infectious disease screening: HIV declined  6. Osteoarthritis of multiple joints, unspecified osteoarthritis type - No records received - referral to new rheumatologist in the [place] area. - Patient to continue to go to pain specialist at [facility]

## 2014-12-02 ENCOUNTER — Telehealth: Payer: Self-pay | Admitting: Family Medicine

## 2014-12-02 NOTE — Telephone Encounter (Signed)
Please call pt "[name]": - all of her labs are normal. Her referral for rheumatology and GI have been placed and she should hear from someone soon concerning scheduling.

## 2014-12-02 NOTE — Telephone Encounter (Signed)
Reviewed lab results and referral information with patient.

## 2014-12-02 NOTE — Telephone Encounter (Signed)
Just an FYI per the patient-she wanted you to know she was able to make an appt at [facility] without a referral to address her ADD.

## 2014-12-13 ENCOUNTER — Ambulatory Visit (INDEPENDENT_AMBULATORY_CARE_PROVIDER_SITE_OTHER): Payer: Managed Care, Other (non HMO) | Admitting: Licensed Clinical Social Worker

## 2014-12-13 DIAGNOSIS — F4323 Adjustment disorder with mixed anxiety and depressed mood: Secondary | ICD-10-CM | POA: Diagnosis not present

## 2014-12-28 ENCOUNTER — Telehealth: Payer: Self-pay

## 2014-12-28 NOTE — Telephone Encounter (Signed)
Received a notice from [facility] for an order for cpap supplies on pt. Dr. Brett Fairy has not seen this pt for over three years. Pt needs to come in for a new patient consult to establish cpap care again. She will also need a referral to make an appt since it has been over three years.  Pts number's listed do not have a voicemail set up. Unable to leave a message. Will return the order to [facility] advising them that pt needs to be seen here before Dr. Brett Fairy can sign for supplies.

## 2015-01-04 ENCOUNTER — Telehealth: Payer: Self-pay | Admitting: *Deleted

## 2015-01-04 NOTE — Telephone Encounter (Signed)
Patient called and left message stating she needs a new mask for her CPAP machine but [facility] requires a RX for this. Patient wants to know if you would write a script for this. Please advise.

## 2015-01-05 ENCOUNTER — Ambulatory Visit: Payer: Managed Care, Other (non HMO) | Admitting: Licensed Clinical Social Worker

## 2015-01-07 ENCOUNTER — Encounter: Payer: Self-pay | Admitting: Family Medicine

## 2015-01-12 ENCOUNTER — Encounter: Payer: Managed Care, Other (non HMO) | Admitting: Gynecology

## 2015-02-08 ENCOUNTER — Encounter: Payer: Self-pay | Admitting: Family Medicine

## 2015-02-08 DIAGNOSIS — R41840 Attention and concentration deficit: Secondary | ICD-10-CM | POA: Insufficient documentation

## 2015-02-08 DIAGNOSIS — F1120 Opioid dependence, uncomplicated: Secondary | ICD-10-CM | POA: Insufficient documentation

## 2015-02-08 DIAGNOSIS — F1190 Opioid use, unspecified, uncomplicated: Secondary | ICD-10-CM | POA: Insufficient documentation

## 2015-03-04 ENCOUNTER — Other Ambulatory Visit: Payer: Self-pay | Admitting: Endocrinology

## 2015-03-04 DIAGNOSIS — E049 Nontoxic goiter, unspecified: Secondary | ICD-10-CM

## 2015-03-07 ENCOUNTER — Ambulatory Visit
Admission: RE | Admit: 2015-03-07 | Discharge: 2015-03-07 | Disposition: A | Discharge status: Home / Self Care | Payer: Managed Care, Other (non HMO) | Source: Ambulatory Visit | Attending: Endocrinology | Admitting: Endocrinology

## 2015-03-07 DIAGNOSIS — E049 Nontoxic goiter, unspecified: Secondary | ICD-10-CM

## 2015-03-14 ENCOUNTER — Encounter: Payer: Self-pay | Admitting: Family Medicine

## 2015-03-14 DIAGNOSIS — K219 Gastro-esophageal reflux disease without esophagitis: Secondary | ICD-10-CM | POA: Insufficient documentation

## 2015-06-07 ENCOUNTER — Ambulatory Visit: Payer: Managed Care, Other (non HMO) | Admitting: Family Medicine

## 2016-01-26 ENCOUNTER — Encounter: Payer: Self-pay | Admitting: *Deleted

## 2016-01-26 ENCOUNTER — Telehealth: Payer: Self-pay | Admitting: Family Medicine

## 2016-01-26 DIAGNOSIS — G4730 Sleep apnea, unspecified: Secondary | ICD-10-CM | POA: Insufficient documentation

## 2016-01-26 NOTE — Telephone Encounter (Signed)
Patient checking to see if we got Rx request from [facility] for mask, tubing, and various CPAP supplies. Please call her back.

## 2016-01-26 NOTE — Telephone Encounter (Signed)
That is ok. We can provide her with the script for equipment. She needs to be seen at least yearly here for physical. I am uncertain who she is following with for her supplies/fitting etc, but she needs to make certain she is routinely being evaluated to ensure proper fit and settings.

## 2016-01-26 NOTE — Telephone Encounter (Signed)
Patient called requesting we sign an order for new tubing and mask for her CPAP she states her old mask is not fitting correctly and she has been unable to use it. Patient has not been seen since 12/01/14.Advised patient she would need an appt she states she is unable to come in until after the first of the year but needs this Rx before end of year so her Insurance will pay for it. She states she will make an appt after first of the year. Please advise

## 2016-01-27 NOTE — Telephone Encounter (Signed)
Spoke with patient she states she will call back to schedule an appt. She states she is waiting on the supply company to call her back regarding her CPAP settings. Let patient know if they cant routinely fit her and check her settings for her  She may need referral to pulmonology to follow for her sleep apnea. Patient verbalized understanding and will let us know if she needs referral at her appt.

## 2016-02-03 ENCOUNTER — Ambulatory Visit (INDEPENDENT_AMBULATORY_CARE_PROVIDER_SITE_OTHER): Payer: BLUE CROSS/BLUE SHIELD | Admitting: Family Medicine

## 2016-02-03 ENCOUNTER — Encounter: Payer: Self-pay | Admitting: Family Medicine

## 2016-02-03 VITALS — BP 126/85 | HR 75 | Temp 98.1°F | Resp 20 | Wt 132.5 lb

## 2016-02-03 DIAGNOSIS — I10 Essential (primary) hypertension: Secondary | ICD-10-CM | POA: Insufficient documentation

## 2016-02-03 DIAGNOSIS — R030 Elevated blood-pressure reading, without diagnosis of hypertension: Secondary | ICD-10-CM | POA: Diagnosis not present

## 2016-02-03 NOTE — Patient Instructions (Signed)
Your BP is normal here today.  We will communicate this with your provider.  Try relaxation techniques we discussed today and leaving early to appointments etc.   Please help [place] help you:  It is a privilege to be able to take care of great patients such as yourself. We are honored you have chosen [facility] for your Primary Care home. Below you will find basic instructions that you may need to access in the future. Please help [place] help you by reading the instructions, which cover many of the frequent questions we experience.   Prescription refills and request:  -In order to allow more efficient response time, please call your pharmacy for all refills. They will forward the request electronically to [place]. This allows for the quickest possible response. Request left on a nurse line can take longer to refill, since these are checked as time allows between office patients and other phone calls.  - refill request can take up to 3-5 working days to complete.  - If request is sent electronically and request is appropiate, it is usually completed in 1-2 business days.  - all patients will need to be seen routinely for all chronic medical conditions requiring prescription medications (see follow-up below). If you are overdue for follow up on your condition, you will be asked to make an appointment and we will call in enough medication to cover you until your appointment (up to 30 days).  - all controlled substances will require a face to face visit to request/refill.  - if you desire your prescriptions to go through a new pharmacy, and have an active script at original pharmacy, you will need to call your pharmacy and have scripts transferred to new pharmacy. This is completed between the pharmacy locations and not by your provider.    Results: If any images or labs were ordered, it can take up to 1 week to get results depending on the test ordered and the lab/facility running and resulting the test. -  Normal or stable results, which do not need further discussion, will be released to your mychart immediately with attached note to you. A call will not be generated for normal results. Please make certain to sign up for mychart. If you have questions on how to activate your mychart you can call the front office.  - If your results need further discussion, our office will attempt to contact you via phone, and if unable to reach you after 2 attempts, we will release your abnormal result to your mychart with instructions.  - All results will be automatically released in mychart after 1 week.  - Your provider will provide you with explanation and instruction on all relevant material in your results. Please keep in mind, results and labs may appear confusing or abnormal to the untrained eye, but it does not mean they are actually abnormal for you personally. If you have any questions about your results that are not covered, or you desire more detailed explanation than what was provided, you should make an appointment with your provider to do so.   Our office handles many outgoing and incoming calls daily. If we have not contacted you within 1 week about your results, please check your mychart to see if there is a message first and if not, then contact our office.  In helping with this matter, you help decrease call volume, and therefore allow [place] to be able to respond to patients needs more efficiently.   Acute office  visits (sick visit):  An acute visit is intended for a new problem and are scheduled in shorter time slots to allow schedule openings for patients with new problems. This is the appropriate visit to discuss a new problem. In order to provide you with excellent quality medical care with proper time for you to explain your problem, have an exam and receive treatment with instructions, these appointments should be limited to one new problem per visit. If you experience a new problem, in which you desire  to be addressed, please make an acute office visit, we save openings on the schedule to accommodate you. Please do not save your new problem for any other type of visit, let us take care of it properly and quickly for you.   Follow up visits:  Depending on your condition(s) your provider will need to see you routinely in order to provide you with quality care and prescribe medication(s). Most chronic conditions (Example: hypertension, Diabetes, depression/anxiety... etc), require visits a couple times a year. Your provider will instruct you on proper follow up for your personal medical conditions and history. Please make certain to make follow up appointments for your condition as instructed. Failing to do so could result in lapse in your medication treatment/refills. If you request a refill, and are overdue to be seen on a condition, we will always provide you with a 30 day script (once) to allow you time to schedule.    Medicare wellness (well visit): - we have a wonderful Nurse Maudie Mercury), that will meet with you and provide you will yearly medicare wellness visits. These visits should occur yearly (can not be scheduled less than 1 calendar year apart) and cover preventive health, immunizations, advance directives and screenings you are entitled to yearly through your medicare benefits. Do not miss out on your entitled benefits, this is when medicare will pay for these benefits to be ordered for you.  These are strongly encouraged by your provider and is the appropriate type of visit to make certain you are up to date with all preventive health benefits. If you have not had your medicare wellness exam in the last 12 months, please make certain to schedule one by calling the office and schedule your medicare wellness with Maudie Mercury as soon as possible.   Yearly physical (well visit):  - Adults are recommended to be seen yearly for physicals. Check with your insurance and date of your last physical, most insurances  require one calendar year between physicals. Physicals include all preventive health topics, screenings, medical exam and labs that are appropriate for gender/age and history. You may have fasting labs needed at this visit. This is a well visit (not a sick visit), acute topics should not be covered during this visit.  - Pediatric patients are seen more frequently when they are younger. Your provider will advise you on well child visit timing that is appropriate for your their age. - This is not a medicare wellness visit. Medicare wellness exams do not have an exam portion to the visit. Some medicare companies allow for a physical, some do not allow a yearly physical. If your medicare allows a yearly physical you can schedule the medicare wellness with our nurse Maudie Mercury and have your physical with your provider after, on the same day. Please check with insurance for your full benefits.   Late Policy/No Shows:  - all new patients should arrive 15-30 minutes earlier than appointment to allow [place] time  to  obtain all personal demographics,  insurance information and for you to complete office paperwork. - All established patients should arrive 10-15 minutes earlier than appointment time to update all information and be checked in .  - In our best efforts to run on time, if you are late for your appointment you will be asked to either reschedule or if able, we will work you back into the schedule. There will be a wait time to work you back in the schedule,  depending on availability.  - If you are unable to make it to your appointment as scheduled, please call 24 hours ahead of time to allow [place] to fill the time slot with someone else who needs to be seen. If you do not cancel your appointment ahead of time, you may be charged a no show fee.

## 2016-02-03 NOTE — Progress Notes (Signed)
SRUTI DONAT , 19-Jun-1970, 46 y.o., female MRN: [id number] Patient Care Team    Relationship Specialty Notifications Start End  Ma Hillock, DO PCP - General Family Medicine  12/01/14     CC: elevated BP reading.  Subjective: Pt presents for an acute OV with complaints of elevated BP at her mood d/o clinic appt. Her Clinician did not want to continue stimulants without her following with her doctor. She has a h/o hypertension and reports compliance with her medication of losartan 100 mg QD.  She denies chest pain, shortness of breath or LE edema. She states she has increased anxiety trying to get to her appt over at mood clinic and she also had anxiety from a phone conversation she had that morning. She reports checking her BP at home, and has normal BP readings.    Allergies  Allergen Reactions  . Doxycycline Other (See Comments)    Hole in esophagus  . Tetracycline Other (See Comments)    Told not to take it since it's similar to Doxycylcine   Social History  Substance Use Topics  . Smoking status: Never Smoker  . Smokeless tobacco: Never Used  . Alcohol use No   Past Medical History:  Diagnosis Date  . Anemia    iron supplement added at the time  . Arthritis    Osteoarthritis (knees, hands)  . Bilateral chronic knee pain   . Chronic headache   . Chronic narcotic use   . Chronic neck pain   . Chronic pain of left upper extremity   . Chronic scapular pain   . Colon polyps   . Cutaneous vasculitis   . Dizziness   . Endometriosis   . Fibromyalgia 12/2010  . GERD (gastroesophageal reflux disease)   . Goiter   . Hypertension    with pain, once pain controlled no meds needed.  . IC (interstitial cystitis)   . Neuromuscular disorder ([place])   . Pyodermic gangrenosum   . Raynaud's syndrome   . Sleep apnea   . Sleep apnea   . Tachycardia   . Thoracic outlet syndrome 2004  . Ulcer ([place])    from vasculitis  . Vasculitis ([place]) 03/2011  . 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 ABDOMINAL HYSTERECTOMY     TLH  . hemmorrhoidectomy    . OVARIAN CYST REMOVAL    . PELVIC LAPAROSCOPY  2007   RSO   Family History  Problem Relation Age of Onset  . Diabetes Mother   . Hypertension Mother   . Cancer Mother 69    melanoma of the eye with mets   Allergies as of 02/03/2016      Reactions   Doxycycline Other (See Comments)   Hole in esophagus   Tetracycline Other (See Comments)   Told not to take it since it's similar to Doxycylcine      Medication List       Accurate as of 02/03/16 10:49 AM. Always use your most recent med list.          acetaminophen 500 MG tablet Commonly known as:  TYLENOL Take 1,000 mg by mouth every 8 (eight) hours as needed for moderate pain. For pain   BAYER BACK & BODY PAIN EX ST 500-32.5 MG Tabs Generic drug:  Aspirin-Caffeine Take 1 tablet by mouth 2 (two) times daily. For pain   busPIRone 15 MG tablet Commonly known as:  BUSPAR TAKE 1  TABLET 3 TIMES DAILY WITH FOOD   diclofenac sodium 1 % Gel Commonly known as:  VOLTAREN Apply topically.   ibuprofen 600 MG tablet Commonly known as:  ADVIL,MOTRIN Take 1 tablet (600 mg total) by mouth every 8 (eight) hours as needed.   levothyroxine 75 MCG tablet Commonly known as:  SYNTHROID, LEVOTHROID Take 75 mcg by mouth daily.   lidocaine 5 % Commonly known as:  [place] onto the skin.   liothyronine 5 MCG tablet Commonly known as:  CYTOMEL   losartan 100 MG tablet Commonly known as:  COZAAR Take 100 mg by mouth.   methadone 10 MG tablet Commonly known as:  DOLOPHINE Take 10 mg by mouth 4 (four) times daily.   MYDAYIS 50 MG Cp24 Generic drug:  Amphet-Dextroamphet 3-Bead ER TAKE ONE CAPSULE BY MOUTH EVERY MORNING FOR 7 DAYS   omeprazole 40 MG capsule Commonly known as:  PRILOSEC TAKE 1 CAPSULE (40 MG TOTAL) BY MOUTH DAILY BEFORE DINNER.       No results found for this or any previous visit (from the  past 24 hour(s)). No results found.   ROS: Negative, with the exception of above mentioned in HPI   Objective:  BP 126/85 (BP Location: Right Arm, Patient Position: Sitting, Cuff Size: Normal)   Pulse 75   Temp 98.1 F (36.7 C)   Resp 20   Wt 132 lb 8 oz (60.1 kg)   SpO2 99%   BMI 21.39 kg/m  Body mass index is 21.39 kg/m. Gen: Afebrile. No acute distress. Nontoxic in appearance, well developed, well nourished.  HENT: AT. [place].MMM Eyes:Pupils Equal Round Reactive to light, Extraocular movements intact,  Conjunctiva without redness, discharge or icterus. CV: RRR, no edema Chest: CTAB, no wheeze or crackles. Good air movement, normal resp effort.  Psych: Normal affect, dress and demeanor. Normal speech. Normal thought content and judgment.  Assessment/Plan: ALASHIA FATIMA is a 46 y.o. female present for acute OV for  Elevated blood pressure reading Essential hypertension, benign - her BP is normal.  - discussed leaving earlier for her appts to mood clinic so she has time after driving, to calm down her nerves.  - avoid added stimulants (other than medication regimen), such as caffeine etc - attempt calming exercises, deep breathing etc.  - will try to send visit to her mood specialist. At this time would alter medication regimen, her Bp are normal.  - F/U as routine with chronic issues  electronically signed by:  Howard Pouch, DO  [place]

## 2016-02-07 ENCOUNTER — Telehealth: Payer: Self-pay | Admitting: Family Medicine

## 2016-02-07 NOTE — Telephone Encounter (Signed)
Patient calling to ask if pcp would start prescribing and managing her MYDAYIS 50 MG CP24 prescription.  This medication has previously has been prescribed by Dr. Annalee Genta.  However, she apparently will not continue prescribing this due to patient's blood pressure being elevated at a couple of her office visits.  Patient reports her bp was elevated due to having to drive all the way to [facility] and trying to prevent being late for her appt.  Please return call to notify patient if pcp is willing to prescribe this med.

## 2016-02-07 NOTE — Telephone Encounter (Signed)
Spoke with patient per Dr Raoul Pitch we do not prescribe this type of medications patient needs to continue seeing psychiatrist. Patient states the drive makes her nervous so her BP is elevated and they will not Rx this for her. Advised patient per Dr Raoul Pitch patient needs to plan ahead and arrive early for her appt so she has resting period prior to BP being taken also recent office visit note regarding normal blood pressure was sent to her psychiatrist at her request. Patient verbalized understanding.

## 2016-03-26 ENCOUNTER — Telehealth: Payer: Self-pay | Admitting: *Deleted

## 2016-03-26 ENCOUNTER — Emergency Department (HOSPITAL_COMMUNITY)
Admission: EM | Admit: 2016-03-26 | Discharge: 2016-03-26 | Disposition: A | Discharge status: Home / Self Care | Payer: BLUE CROSS/BLUE SHIELD | Attending: Emergency Medicine | Admitting: Emergency Medicine

## 2016-03-26 ENCOUNTER — Emergency Department (HOSPITAL_COMMUNITY): Payer: BLUE CROSS/BLUE SHIELD

## 2016-03-26 ENCOUNTER — Encounter (HOSPITAL_COMMUNITY): Payer: Self-pay | Admitting: *Deleted

## 2016-03-26 DIAGNOSIS — Z79899 Other long term (current) drug therapy: Secondary | ICD-10-CM | POA: Diagnosis not present

## 2016-03-26 DIAGNOSIS — R21 Rash and other nonspecific skin eruption: Secondary | ICD-10-CM | POA: Diagnosis present

## 2016-03-26 DIAGNOSIS — E039 Hypothyroidism, unspecified: Secondary | ICD-10-CM | POA: Diagnosis not present

## 2016-03-26 DIAGNOSIS — I10 Essential (primary) hypertension: Secondary | ICD-10-CM | POA: Diagnosis not present

## 2016-03-26 DIAGNOSIS — Z7982 Long term (current) use of aspirin: Secondary | ICD-10-CM | POA: Insufficient documentation

## 2016-03-26 LAB — COMPREHENSIVE METABOLIC PANEL
ALT: 15 U/L (ref 14–54)
AST: 21 U/L (ref 15–41)
Albumin: 4.1 g/dL (ref 3.5–5.0)
Alkaline Phosphatase: 44 U/L (ref 38–126)
Anion gap: 10 (ref 5–15)
BILIRUBIN TOTAL: 0.5 mg/dL (ref 0.3–1.2)
BUN: 13 mg/dL (ref 6–20)
CHLORIDE: 103 mmol/L (ref 101–111)
CO2: 25 mmol/L (ref 22–32)
Calcium: 8.9 mg/dL (ref 8.9–10.3)
Creatinine, Ser: 0.64 mg/dL (ref 0.44–1.00)
GFR calc Af Amer: >60 mL/min (ref >60)
Glucose, Bld: 151 mg/dL — ABNORMAL HIGH (ref 65–99)
POTASSIUM: 3.4 mmol/L — AB (ref 3.5–5.1)
Sodium: 138 mmol/L (ref 135–145)
TOTAL PROTEIN: 6.9 g/dL (ref 6.5–8.1)

## 2016-03-26 LAB — DIFFERENTIAL
BASOS ABS: 0 10*3/uL (ref 0.0–0.1)
Basophils Relative: 1 %
EOS PCT: 1 %
Eosinophils Absolute: 0.1 10*3/uL (ref 0.0–0.7)
LYMPHS PCT: 24 %
Lymphs Abs: 1.4 10*3/uL (ref 0.7–4.0)
Monocytes Absolute: 0.3 10*3/uL (ref 0.1–1.0)
Monocytes Relative: 6 %
NEUTROS ABS: 4 10*3/uL (ref 1.7–7.7)
NEUTROS PCT: 68 %

## 2016-03-26 LAB — URINALYSIS, ROUTINE W REFLEX MICROSCOPIC
BILIRUBIN URINE: NEGATIVE
Bacteria, UA: NONE SEEN
Glucose, UA: NEGATIVE mg/dL
Ketones, ur: NEGATIVE mg/dL
Leukocytes, UA: NEGATIVE
Nitrite: NEGATIVE
Protein, ur: NEGATIVE mg/dL
Specific Gravity, Urine: 1.015 (ref 1.005–1.030)
pH: 6 (ref 5.0–8.0)

## 2016-03-26 LAB — CBC
HEMATOCRIT: 37.6 % (ref 36.0–46.0)
Hemoglobin: 12.8 g/dL (ref 12.0–15.0)
MCH: 30.3 pg (ref 26.0–34.0)
MCHC: 34 g/dL (ref 30.0–36.0)
MCV: 89.1 fL (ref 78.0–100.0)
PLATELETS: 320 10*3/uL (ref 150–400)
RBC: 4.22 MIL/uL (ref 3.87–5.11)
RDW: 12.2 % (ref 11.5–15.5)
WBC: 6.1 10*3/uL (ref 4.0–10.5)

## 2016-03-26 MED ORDER — SULFAMETHOXAZOLE-TRIMETHOPRIM 800-160 MG PO TABS: 1.0000 | ORAL_TABLET | Freq: Two times a day (BID) | ORAL | 0 refills | Status: AC

## 2016-03-26 MED ORDER — DIPHENHYDRAMINE HCL 50 MG/ML IJ SOLN
50.0000 mg | Freq: Once | INTRAMUSCULAR | Status: AC
  Administered 2016-03-26: 50 mg via INTRAVENOUS
  Filled 2016-03-26: qty 1

## 2016-03-26 MED ORDER — IOPAMIDOL (ISOVUE-300) INJECTION 61%
INTRAVENOUS | Status: AC
  Administered 2016-03-26: 75 mL
  Filled 2016-03-26: qty 75

## 2016-03-26 MED ORDER — LORAZEPAM 2 MG/ML IJ SOLN
0.5000 mg | Freq: Once | INTRAMUSCULAR | Status: AC
Start: 2016-03-26 — End: 2016-03-26
  Administered 2016-03-26: 0.5 mg via INTRAVENOUS
  Filled 2016-03-26: qty 1

## 2016-03-26 NOTE — ED Notes (Signed)
Pt c/o of rash on face since 12-24-15. Pt called PCP and they referred her to [facility].  Pt c/o of HA and yellow splotchy areas on her face. Pt states that the rash sometimes oozes yellow and clear puss.

## 2016-03-26 NOTE — Telephone Encounter (Signed)
PLEASE NOTE: All timestamps contained within this report are represented as [place] Standard Time. CONFIDENTIALTY NOTICE: This fax transmission is intended only for the addressee. It contains information that is legally privileged, confidential or otherwise protected from use or disclosure. If you are not the intended recipient, you are strictly prohibited from reviewing, disclosing, copying using or disseminating any of this information or taking any action in reliance on or regarding this information. If you have received this fax in error, please notify us immediately by telephone so that we can arrange for its return to [place]. Phone: [phone], Toll-Free: [phone], Fax: [phone] Page: 1 of 2 Call Id: [id number] [address] Patient Name: [name] Gender: Female DOB: January 06, 1971 Age: 46 Y 10 M 29 D Return Phone Number: [phone] (Primary) City/State/Zip: [place] [address] Client [facility] - Client Client Site [place] [facility] Physician Howard Pouch Who Is Calling Patient / Member / Family / Caregiver Call Type Triage / Clinical Relationship To Patient Self Return Phone Number [phone] (Primary) Chief Complaint Abdominal Pain Reason for Call Symptomatic / Request for Health Information Initial Comment Caller states fever 99.3, has a infection on her face ( rash and blisters and pus coming out ) abd pain, itching all over, joint pain and stiffness, lethargic Nurse Assessment Nurse: Sherren Mocha, RN, Pam Date/Time (Eastern Time): 03/25/2016 12:22:14 AM Confirm and document reason for call. If symptomatic, describe symptoms. ---Caller states fever 99.3, has a infection on her face ( rash and blisters and pus coming out ) abd pain, itching all over, joint pain and stiffness, lethargic Does the PT have any chronic conditions? (i.e.  diabetes, asthma, etc.) ---Yes List chronic conditions. ---Vasculitis, Joint pain . Methadone daily. ADHD meds. Lidacaine injection at [facility] Is the patient pregnant or possibly pregnant? (Ask all females between the ages of 31-55) ---No Guidelines Guideline Title Affirmed Question Sores [1] Unexplained sores AND [2] 3 or more Disp. Time Eilene Ghazi Time) Disposition Final User 03/25/2016 12:40:15 AM See Physician within 24 Hours Yes Sherren Mocha, RN, Pam Referrals REFERRED TO PCP OFFICE Care Advice Given Per Guideline SEE PHYSICIAN WITHIN 24 HOURS: * IF OFFICE WILL BE OPEN: You need to be seen within the next 24 hours. Call your doctor when the office opens, and make an appointment. CLEANSING: Wash the infected area with warm water and an antibacterial soap three times a day. ANTIBIOTIC OINTMENT: * Apply an OTC antibiotic ointment (e.g., Bacitracin) 3 times per day. CALL BACK IF: * Fever occurs * You become worse. CARE ADVICE given per Sores (Adult) guideline. * IF OFFICE WILL BE CLOSED AND NO PCP TRIAGE: You need to be seen within the next 24 hours. An urgent care center is often a good source of care if your doctor's office is closed. PLEASE NOTE: All timestamps contained within this report are represented as [place] Standard Time. CONFIDENTIALTY NOTICE: This fax transmission is intended only for the addressee. It contains information that is legally privileged, confidential or otherwise protected from use or disclosure. If you are not the intended recipient, you are strictly prohibited from reviewing, disclosing, copying using or disseminating any of this information or taking any action in reliance on or regarding this information. If you have received this fax in error, please notify us immediately by telephone so that we can arrange for its return to [place]. Phone: [phone], Toll-Free: [phone], Fax: [phone] Page: 2 of 2 Call  Id: [id number]

## 2016-03-26 NOTE — Telephone Encounter (Signed)
I spoke to [place], [place] at [facility] for Dr. Raoul Pitch (CORRECTION: Patient is [facility] patient not [facility]). Vinnie Level agreed that patient needed to go to ER immediately and to schedule a hospital f/u after ER visit due to severity and symptoms given by patient. Awaiting patient's phone call to schedule f/u after hospital visit.

## 2016-03-26 NOTE — Telephone Encounter (Signed)
Patient called today and spoke with Marcene Brawn at [place] office she states her symptoms are worse she was advised to go to ER for evaluation. Patient agreed.

## 2016-03-26 NOTE — Telephone Encounter (Signed)
Patient called to scheduled appointment with PCP because of lesions and a possible fungus on her face. She described a "toothpaste" like substance that was oozing and bleeding out of her lesions inside her jaw yesterday. Patient described lesions as slice marks from knife appearance. She explained that she has not had any oozing and bleeding today however she stated that she was a bit frantic and it was not normal for her. I asked a CMA on how to advise and was told to send patient to emergency room immediately due to the descriptions that she was giving. Patient stated that her dog now has spongy like patches on the skin and is unsure where they came from. The call ended with patient saying she was going to go to the emergency room today. She exclaimed that she could not go anywhere until in the afternoon due to her daughter having her car. [place] after further explanation to clinical supervisor to speak to [facility] on further advise on [facility]'s patient.

## 2016-03-26 NOTE — ED Notes (Signed)
Called lab to add a differential onto the CBC

## 2016-03-26 NOTE — ED Provider Notes (Signed)
[facility] DEPT Provider Note   CSN: [id number] Arrival date & time: 03/26/16  1517     History   Chief Complaint Chief Complaint  Patient presents with  . Rash    HPI MONEKE LINCICOME is a 46 y.o. female.  HPI  46 y.o. femlae with history of vasculitis and arthritis presents today complaining of rash and swelling.  States she was told to come to ED by doctor's office and be checked for infection.  Patient called to scheduled appointment with PCP because of lesions and a possible fungus on her face. From call to office-"She described a "toothpaste" like substance that was oozing and bleeding out of her lesions inside her jaw yesterday. Patient described lesions as slice marks from knife appearance. She explained that she has not had any oozing and bleeding today however she stated that she was a bit frantic and it was not normal for her. I asked a CMA on how to advise and was told to send patient to emergency room immediately due to the descriptions that she was giving. Patient stated that her dog now has spongy like patches on the skin and is unsure where they came from. The call ended with patient saying she was going to go to the emergency room today. She exclaimed that she could not go anywhere until in the afternoon due to her daughter having her car. [facility] after further explanation to clinical supervisor to speak to [facility] on further advise on [facility]'s patient."   Past Medical History:  Diagnosis Date  . Anemia    iron supplement added at the time  . Arthritis    Osteoarthritis (knees, hands)  . Bilateral chronic knee pain   . Chronic headache   . Chronic narcotic use   . Chronic neck pain   . Chronic pain of left upper extremity   . Chronic scapular pain   . Colon polyps   . Cutaneous vasculitis   . Dizziness   . Endometriosis   . Fibromyalgia 12/2010  . GERD (gastroesophageal reflux disease)   . Goiter   . Hypertension    with pain, once pain  controlled no meds needed.  . IC (interstitial cystitis)   . Neuromuscular disorder ([place])   . Pyodermic gangrenosum   . Raynaud's syndrome   . Sleep apnea   . Sleep apnea   . Tachycardia   . Thoracic outlet syndrome 2004  . Ulcer ([place])    from vasculitis  . Vasculitis ([place]) 03/2011  . Vasculitis limited to skin     Patient Active Problem List   Diagnosis Date Noted  . Essential hypertension, benign 02/03/2016  . Sleep apnea   . GERD (gastroesophageal reflux disease) 03/14/2015  . Inattention 02/08/2015  . Methadone use ([place]) 02/08/2015  . History of anemia 12/01/2014  . Hypothyroidism 12/01/2014  . Screening cholesterol level 12/01/2014  . Healthcare maintenance 12/01/2014  . Osteoarthritis of multiple joints 12/01/2014  . History of colonic polyps 12/01/2014  . Fibromyalgia 06/13/2011  . Neck pain, chronic 05/14/2011  . Chronic scapular pain 05/14/2011  . Knee pain, bilateral 05/14/2011    Past Surgical History:  Procedure Laterality Date  . ABDOMINAL HYSTERECTOMY     TLH  . hemmorrhoidectomy    . OVARIAN CYST REMOVAL    . PELVIC LAPAROSCOPY  2007   RSO    OB History    Gravida Para Term Preterm AB Living   3 2 2  0 1 2   SAB TAB  Ectopic Multiple Live Births   1 0 0 0         Home Medications    Prior to Admission medications   Medication Sig Start Date End Date Taking? Authorizing Provider  acetaminophen (TYLENOL) 500 MG tablet Take 1,000 mg by mouth every 8 (eight) hours as needed for moderate pain. For pain    Historical Provider, MD  Aspirin-Caffeine (BAYER BACK & BODY PAIN EX ST) 500-32.5 MG TABS Take 1 tablet by mouth 2 (two) times daily. For pain    Historical Provider, MD  busPIRone (BUSPAR) 15 MG tablet TAKE 1 TABLET 3 TIMES DAILY WITH FOOD 09/30/15   Historical Provider, MD  ibuprofen (ADVIL,MOTRIN) 600 MG tablet Take 1 tablet (600 mg total) by mouth every 8 (eight) hours as needed. 11/02/14   Huel Cote, NP  levothyroxine (SYNTHROID, LEVOTHROID)  75 MCG tablet Take 75 mcg by mouth daily.    Jacelyn Pi, MD  lidocaine (LIDODERM) 5 % Place onto the skin. 01/27/16   Historical Provider, MD  liothyronine (CYTOMEL) 5 MCG tablet  05/28/13   Historical Provider, MD  losartan (COZAAR) 100 MG tablet Take 100 mg by mouth. 05/06/14   Historical Provider, MD  methadone (DOLOPHINE) 10 MG tablet Take 10 mg by mouth 4 (four) times daily.    Historical Provider, MD  MYDAYIS 50 MG CP24 TAKE ONE CAPSULE BY MOUTH EVERY MORNING FOR 7 DAYS 01/16/16   Historical Provider, MD  omeprazole (PRILOSEC) 40 MG capsule TAKE 1 CAPSULE (40 MG TOTAL) BY MOUTH DAILY BEFORE DINNER. 12/11/15   Historical Provider, MD    Family History Family History  Problem Relation Age of Onset  . Diabetes Mother   . Hypertension Mother   . Cancer Mother 39    melanoma of the eye with mets    Social History Social History  Substance Use Topics  . Smoking status: Never Smoker  . Smokeless tobacco: Never Used  . Alcohol use No     Allergies   Doxycycline and Tetracycline   Review of Systems Review of Systems  Constitutional: Positive for activity change.  Cardiovascular: Negative.   Gastrointestinal: Negative.   Genitourinary: Negative.   Musculoskeletal: Positive for arthralgias and joint swelling.  Skin: Positive for rash and wound.  All other systems reviewed and are negative.    Physical Exam Updated Vital Signs BP 130/80 (BP Location: Right Arm)   Pulse 82   Temp 98.7 F (37.1 C) (Oral)   Resp 16   SpO2 100%   Physical Exam  Constitutional: She is oriented to person, place, and time.  HENT:  Head: Normocephalic and atraumatic.  Eyes: EOM are normal. Pupils are equal, round, and reactive to light.  Neck: Normal range of motion. Neck supple.  Cardiovascular: Normal rate and regular rhythm.   Pulmonary/Chest: Effort normal and breath sounds normal.  Abdominal: Soft.  Musculoskeletal: Normal range of motion.  Neurological: She is alert and oriented  to person, place, and time. No cranial nerve deficit. Coordination normal.  Skin: Skin is warm. Capillary refill takes less than 2 seconds.  Patchy discoloration maculopapular lesions face and arms Lesions blanch Some swelling left jaw,no fluctuance noted  Psychiatric: She has a normal mood and affect.  Nursing note and vitals reviewed.    ED Treatments / Results  Labs (all labs ordered are listed, but only abnormal results are displayed) Labs Reviewed  URINALYSIS, ROUTINE W REFLEX MICROSCOPIC - Abnormal; Notable for the following:       Result  Value   Hgb urine dipstick MODERATE (*)    Squamous Epithelial / LPF 0-5 (*)    All other components within normal limits  COMPREHENSIVE METABOLIC PANEL - Abnormal; Notable for the following:    Potassium 3.4 (*)    Glucose, Bld 151 (*)    All other components within normal limits  CBC  URINALYSIS, ROUTINE W REFLEX MICROSCOPIC    EKG  EKG Interpretation None       Radiology Ct Maxillofacial W Contrast  Result Date: 03/26/2016 CLINICAL DATA:  Left-sided jaw wound with facial swelling and scarring. Skin itching. EXAM: CT MAXILLOFACIAL WITH CONTRAST TECHNIQUE: Multidetector CT imaging of the maxillofacial structures was performed. Multiplanar CT image reconstructions were also generated. A small metallic BB was placed on the right temple in order to reliably differentiate right from left. COMPARISON:  05/31/2013 head CT FINDINGS: Osseous: No acute fracture nor bone destruction. Patient is nearly edentulous. Orbits: Intact orbits, globes and lenses bilaterally. Sinuses: Mucous retention cyst of the left maxillary sinus. Soft tissues: Peri-oral soft tissue induration and slight skin thickening best appreciated on the sagittal reformatted images without focal abscess collection. Limited intracranial: Nonacute IMPRESSION: Indurated peri-oral subcutaneous soft tissues and slight thickening of the dermis likely related to chronic  inflammation/cellulitis. No focal abscess collection. No underlying bone destruction. Electronically Signed   By: Ashley Royalty M.D.   On: 03/26/2016 20:28    Procedures Procedures (including critical care time)  Medications Ordered in ED Medications  diphenhydrAMINE (BENADRYL) injection 50 mg (50 mg Intravenous Given 03/26/16 1832)  LORazepam (ATIVAN) injection 0.5 mg (0.5 mg Intravenous Given 03/26/16 1859)  iopamidol (ISOVUE-300) 61 % injection (75 mLs  Contrast Given 03/26/16 1954)     Initial Impression / Assessment and Plan / ED Course  I have reviewed the triage vital signs and the nursing notes.  Pertinent labs & imaging results that were available during my care of the patient were reviewed by me and considered in my medical decision making (see chart for details).    9:08 PM Patient states that she came here to find out if she had a fungal infection and feels that the differential should be done on her blood. I have explained the patient that running a differential and a normal white blood cell count is unlikely to yield an etiology such as fungemia. However, patient is insistent.  Plan to call out to lab to see  if this can be added on to previous CBC 10:35 PM Diff normal.  Discussed with patient.  Will treat for possible mrsa with rash and advised f/u.   Final Clinical Impressions(s) / ED Diagnoses   Final diagnoses:  Rash    New Prescriptions New Prescriptions   No medications on file     Pattricia Boss, MD 03/26/16 2238

## 2016-03-26 NOTE — ED Notes (Signed)
Pt verbalized understanding discharge instructions and denies any further needs or questions at this time. VS stable, ambulatory and steady gait.   

## 2016-03-26 NOTE — ED Notes (Signed)
Ct called to inform that pt is ready for CT scan

## 2016-03-26 NOTE — ED Notes (Signed)
Patient transported to CT 

## 2016-03-26 NOTE — ED Triage Notes (Signed)
The pt is here for a rash on her face for a long time  She has been to [facility] for the same today she reports she was told to come here for various tests.  She has been using tweezers to puncture the lesions on her face.  She reports that her mouth oozes thick liquid from her gums  She goes to [facility] once a month for iv lidocaine????   She is also on methadone for ???? lmp none

## 2016-03-27 ENCOUNTER — Telehealth: Payer: Self-pay | Admitting: Family Medicine

## 2016-03-27 NOTE — Telephone Encounter (Signed)
noted 

## 2016-03-27 NOTE — Telephone Encounter (Signed)
[place] - Client [facility] Patient Name: [name] DOB: 08-10-1970 Initial Comment Caller says she has been having high blood pressure has been, rash on her face, headaches, joint pain, weird feelings in her chest, and her heart rate has been up. She was seen in the ER and they wouldn't run any tests for a fungal infection. She had a CT scan done on her jaw and they said she has cellulitis. Nurse Assessment Nurse: Markus Daft, RN, Sherre Poot Date/Time (Eastern Time): 03/27/2016 2:15:55 PM Confirm and document reason for call. If symptomatic, describe symptoms. ---Caller states she has been having HTN, rash on her face with yellow discharge, headaches, joint pain, weird feelings in her chest, and her heart rate feels fast. - She was seen in the ER yesterday, and they wouldn't run any tests for a fungal infection. CT scan done on her jaw and they said she has cellulites. Had chest heaviness & SOB while she was there after IV put in and had injected the IV with Benadryl. She wanted to speak but couldn't get the words out. The nurse said, maybe he pushed the Benadryl too fast. BP shot up to 169/115 during episode. Nurse dismissed her chest discomfort. They thought she had a panic attack, but she didn't feel anxious. -- She still doesn't feel right and left arm is still aching. She has appt tomorrow AM with MD. Should she be seen sooner? They wouldn't do an EKG. Does the patient have any new or worsening symptoms? ---Yes Will a triage be completed? ---Yes Related visit to physician within the last 2 weeks? ---Yes Does the PT have any chronic conditions? (i.e. diabetes, asthma, etc.) ---Yes List chronic conditions. ---Vasculitis, takes Methadone for pain - left heart enlarged Is the patient pregnant or possibly pregnant? (Ask all females between the ages of 77-55) ---No Is this a behavioral health or substance abuse call?  ---No Guidelines Guideline Title Affirmed Question Affirmed Notes Chest Pain Pain also present in shoulder(s) or arm(s) or jaw (Exception: pain is clearly made worse by movement) Final Disposition User Go to ED Now Markus Daft, RN, Sherre Poot Comments Not having chest pain at this time. She "feels a little heavier in that area."

## 2016-03-28 ENCOUNTER — Ambulatory Visit (INDEPENDENT_AMBULATORY_CARE_PROVIDER_SITE_OTHER): Payer: BLUE CROSS/BLUE SHIELD | Admitting: Family Medicine

## 2016-03-28 ENCOUNTER — Telehealth: Payer: Self-pay | Admitting: Family Medicine

## 2016-03-28 ENCOUNTER — Encounter: Payer: Self-pay | Admitting: Family Medicine

## 2016-03-28 VITALS — BP 127/84 | HR 85 | Temp 97.9°F | Resp 20 | Wt 126.0 lb

## 2016-03-28 DIAGNOSIS — L089 Local infection of the skin and subcutaneous tissue, unspecified: Secondary | ICD-10-CM | POA: Insufficient documentation

## 2016-03-28 DIAGNOSIS — L709 Acne, unspecified: Secondary | ICD-10-CM | POA: Diagnosis not present

## 2016-03-28 DIAGNOSIS — L981 Factitial dermatitis: Secondary | ICD-10-CM

## 2016-03-28 DIAGNOSIS — F424 Excoriation (skin-picking) disorder: Secondary | ICD-10-CM

## 2016-03-28 MED ORDER — HYDROXYZINE HCL 50 MG PO TABS: 50.0000 mg | ORAL_TABLET | Freq: Every evening | ORAL | 1 refills | Status: DC | PRN

## 2016-03-28 MED ORDER — TRIAMCINOLONE ACETONIDE 0.1 % EX CREA: 1.0000 "application " | TOPICAL_CREAM | Freq: Two times a day (BID) | CUTANEOUS | 1 refills | Status: DC

## 2016-03-28 MED ORDER — CLINDAMYCIN PHOSPHATE 1 % EX GEL: Freq: Two times a day (BID) | CUTANEOUS | 5 refills | Status: DC

## 2016-03-28 NOTE — Progress Notes (Signed)
[name] , 1970/10/09, 46 y.o., female MRN: [id number] Patient Care Team    Relationship Specialty Notifications Start End  Ma Hillock, DO PCP - General Family Medicine  12/01/14     CC: facial cellultitis Subjective: Pt presents for an acute OV with complaints of facial cellutlitis of 4-5 Weeks duration.  She was seen in the ED for this condition 03/26/2016. CBC was performed and normal. CT of the face was performed that showed subcutaneous soft tissue swelling likely related to chronic inflammation, no abscess formation. He shouldn't states for the last few years she has noticed "grainy white fragments "that are located just below her skin and become inflamed. She states she removed these grainy white hard fragments and then "instantly feels better ". She reports trying many different over-the-counter acne washes and creams to try to improve her skin condition on her face, and has not found anything to be helpful. She recently tried a benzoyl peroxide product, which she felt irritated her skin. She felt around Thanksgiving the left side of her face had become red, raised/swollen and inflamed and blotches. She endorses small bumps and even little blisters during that time. She say states then about 4-5 weeks ago on the left side of her jaw line there was "slash marks " and she felt as if something was "eating into her jaw ". She reports expelling a fair amount of thick yellowish toothpaste-like consistency drainage from this area at that time. She states she has been doing some reading and she is worried she has a fungal infection, and it could be spreading all over her body. She states that she did try some of her daughter's triamcinolone cream over the red raised area, and it has helped. She has not started the Bactrim that was prescribed in the emergency room.  CT maxillofacial with contrast 03/26/2016:  IMPRESSION: Indurated peri-oral subcutaneous soft tissues and slight thickening of  the dermis likely related to chronic inflammation/cellulitis. No focal abscess collection. No underlying bone destruction.  Allergies  Allergen Reactions  . Doxycycline Other (See Comments)    Hole in esophagus  . Tetracycline Other (See Comments)    Told not to take it since it's similar to Doxycylcine   Social History  Substance Use Topics  . Smoking status: Never Smoker  . Smokeless tobacco: Never Used  . Alcohol use No   Past Medical History:  Diagnosis Date  . Anemia    iron supplement added at the time  . Arthritis    Osteoarthritis (knees, hands)  . Bilateral chronic knee pain   . Chronic headache   . Chronic narcotic use   . Chronic neck pain   . Chronic pain of left upper extremity   . Chronic scapular pain   . Colon polyps   . Cutaneous vasculitis   . Dizziness   . Endometriosis   . Fibromyalgia 12/2010  . GERD (gastroesophageal reflux disease)   . Goiter   . Hypertension    with pain, once pain controlled no meds needed.  . IC (interstitial cystitis)   . Neuromuscular disorder ([place])   . Pyodermic gangrenosum   . Raynaud's syndrome   . Sleep apnea   . Sleep apnea   . Tachycardia   . Thoracic outlet syndrome 2004  . Ulcer ([place])    from vasculitis  . Vasculitis ([place]) 03/2011  . Vasculitis limited to skin    Past Surgical History:  Procedure Laterality Date  . ABDOMINAL HYSTERECTOMY  TLH  . hemmorrhoidectomy    . OVARIAN CYST REMOVAL    . PELVIC LAPAROSCOPY  2007   RSO   Family History  Problem Relation Age of Onset  . Diabetes Mother   . Hypertension Mother   . Cancer Mother 67    melanoma of the eye with mets   Allergies as of 03/28/2016      Reactions   Doxycycline Other (See Comments)   Hole in esophagus   Tetracycline Other (See Comments)   Told not to take it since it's similar to Doxycylcine      Medication List       Accurate as of 03/28/16 12:20 PM. Always use your most recent med list.          acetaminophen 500 MG  tablet Commonly known as:  TYLENOL Take 1,000 mg by mouth 2 (two) times daily. For pain   BAYER BACK & BODY PAIN EX ST 500-32.5 MG Tabs Generic drug:  Aspirin-Caffeine Take 1 tablet by mouth 3 (three) times a week. For pain   busPIRone 15 MG tablet Commonly known as:  BUSPAR TAKE 1 TABLET BY MOUTH AS NEEDED FOR ANXIETY   clindamycin 1 % gel Commonly known as:  CLINDAGEL Apply topically 2 (two) times daily.   hydrOXYzine 50 MG tablet Commonly known as:  ATARAX/VISTARIL Take 1 tablet (50 mg total) by mouth at bedtime as needed.   ibuprofen 200 MG tablet Commonly known as:  ADVIL,MOTRIN Take 600 mg by mouth every 6 (six) hours as needed for moderate pain.   levothyroxine 75 MCG tablet Commonly known as:  SYNTHROID, LEVOTHROID Take 75 mcg by mouth daily.   lidocaine 5 % Commonly known as:  LIDODERM Place 1 patch onto the skin daily as needed (FOR BACK).   liothyronine 5 MCG tablet Commonly known as:  CYTOMEL Take 5 mcg by mouth daily.   losartan 100 MG tablet Commonly known as:  COZAAR Take 100 mg by mouth daily.   methadone 10 MG tablet Commonly known as:  DOLOPHINE Take 10 mg by mouth 4 (four) times daily.   MYDAYIS 50 MG Cp24 Generic drug:  Amphet-Dextroamphet 3-Bead ER TAKE ONE CAPSULE BY MOUTH EVERY MORNING FOR 7 DAYS   omeprazole 40 MG capsule Commonly known as:  PRILOSEC TAKE 1 CAPSULE (40 MG TOTAL) BY MOUTH DAILY BEFORE DINNER.   sulfamethoxazole-trimethoprim 800-160 MG tablet Commonly known as:  BACTRIM DS,SEPTRA DS Take 1 tablet by mouth 2 (two) times daily.   triamcinolone cream 0.1 % Commonly known as:  KENALOG Apply 1 application topically 2 (two) times daily.       No results found for this or any previous visit (from the past 24 hour(s)). No results found.   ROS: Negative, with the exception of above mentioned in HPI   Objective:  BP 127/84 (BP Location: Right Arm, Patient Position: Sitting, Cuff Size: Normal)   Pulse 85   Temp 97.9  F (36.6 C) (Oral)   Resp 20   Wt 126 lb (57.2 kg)   SpO2 99%   BMI 20.34 kg/m  Body mass index is 20.34 kg/m. Gen: Afebrile. No acute distress. Nontoxic in appearance, well developed, well nourished. Caucasian female. HENT: AT. [place].  MMM Eyes:Pupils Equal Round Reactive to light, Extraocular movements intact,  Conjunctiva without redness, discharge or icterus. Skin: Multiple scars from prior episodes of skin picking of her face and entire body. Multiple small excoriated erythemic areas over arms and neck and face. Very mild soft tissue  swelling left jaw line, with excoriated lesions located over. No drainage or abscess formation palpated. Very mild erythemic raised blotchiness left zygomatic/nasolabial fold down to mid neck. No purpura or petechiae.  Neuro: Normal gait. PERLA. EOMi. Alert. Oriented x3 Psych: Very anxious. Mild paranoia. Normal affect, dress and demeanor. Normal speech. Normal thought content and judgment.  Assessment/Plan: LESHON RUEGER is a 46 y.o. female present for acute OV for  Skin infection/Acne, unspecified acne type/Compulsive skin picking - Pilar Plate discussion with patient her condition is likely from her compulsive skin picking. Reassured her little white grainy material things that she is picking is actually supposed to be there and are likely inflamed hair follicles and/or oil/sweat glands. Strongly encouraged her to resist the urge to pick at her skin. The redness located on the left side of her face could be consistent with cellulitis versus skin reaction to benzoyl peroxide acne wash. - Patient encouraged to resist skin picking, wash face 2 times a day with either [place] or Dove sensitive soap. Use clindamycin gel 2 times a day. Prescribed Cream That She Can Apply Sparingly, Avoiding Eyes, and Not for Long-Term Use over the Blotchy Red Raised Area of Irritation. - Since patient states that the majority of her picking occurs during her sleep, discussed the use of  Vistaril daily at bedtime when necessary, patient is agreeable to try this today. Cautioned on sedation properties of medication. - clindamycin (CLINDAGEL) 1 % gel; Apply topically 2 (two) times daily.  Dispense: 30 g; Refill: 5 - triamcinolone cream (KENALOG) 0.1 %; Apply 1 application topically 2 (two) times daily.  Dispense: 30 g; Refill: 1 - hydrOXYzine (ATARAX/VISTARIL) 50 MG tablet; Take 1 tablet (50 mg total) by mouth at bedtime as needed.  Dispense: 90 tablet; Refill: 1 - Avoid benzoyl peroxide products. - She is to take the Bactrim prescribed by other provider until completed. - Use Cetaphil or Cerve cream after shower over her body. - Follow-up as needed, sooner if condition is worsening. Would refer to dermatology.   > 25 minutes spent with patient, >50% of time spent face to face counseling patient.   electronically signed by:  Howard Pouch, DO  [place]

## 2016-03-28 NOTE — Patient Instructions (Signed)
Use the Bactrim prescribed by ED. Wash face 1-2 times a day before applying gel, use only ivory or dove sensitive (no dyes or fragrances).   Clindagel: twice a day after washing. (this an antibiotic/acne medicine to be used daily) Kenalog (triamcinolone) cream over raised red area up 2x a day, but not long term and not around the eye.  Cerve or cetaphil lotion over body after shower daily.    Vistaril/atarax: Use about 1 hour before bed, caution can make some people tired. This is to help stop picking at night.    Try really hard to stop picking in the day.

## 2016-03-28 NOTE — Telephone Encounter (Signed)
Patient needs to know if her diagnosis of cellulitis is contagious.  Thank you,  -LL

## 2016-03-28 NOTE — Telephone Encounter (Signed)
Left message on patient voice mail she is not contagious.

## 2016-03-29 ENCOUNTER — Telehealth: Payer: Self-pay | Admitting: Family Medicine

## 2016-03-29 NOTE — Telephone Encounter (Signed)
Patient instructed to D/C Bactrim DS per Dr. Anitra Lauth. Allergy was listed in chart. Patient to continue clindamycin as instructed.

## 2016-03-29 NOTE — Telephone Encounter (Signed)
Patient is not feeling well at all after taking her first dose of sulfamethoxazole-trimethoprim (BACTRIM DS,SEPTRA DS) 800-160 mg  Would like a call back right away.  Thank you,  -LL

## 2016-04-16 ENCOUNTER — Telehealth: Payer: Self-pay | Admitting: Family Medicine

## 2016-04-16 MED ORDER — MINOCYCLINE HCL 50 MG PO CAPS: 50.0000 mg | ORAL_CAPSULE | Freq: Two times a day (BID) | ORAL | 0 refills | Status: DC

## 2016-04-16 NOTE — Telephone Encounter (Signed)
Please call pt: - I have called in a medicine called minocin. It is in the family as doxy (she is "allergic" but sounds like she had a capsule stuck in her throat that caused issues, not a allergy) - It is low dose and will treat the "infection" and acne.  - The gel does not come in cream, she can try to put on sooner so it will dry, or just use once daily.  - If still having concerns in 1 month, or worsening would refer to dermatology.

## 2016-04-16 NOTE — Telephone Encounter (Signed)
Patient was seen for cellulitis and was given a sulfur antibiotic and clindamycine ointment. She said that she was told to stop taking the antibiotic because of a reaction and to just used the cream. She states that the cream is not working. Patient wants to know if another antibiotic can be called in. Patient states that her chart has that she is allergic to tetracycline and doxycycline. She said that she is willing to try those medications if that would help resolve this issue. Patient states that she cannot come in for an appointment Also wants to know if there is a cream that she can be used instead of gel. She said it isn't working well with her CPAP.  Please advise.

## 2016-04-16 NOTE — Telephone Encounter (Signed)
Patient notified and verbalized understanding. 

## 2016-05-10 ENCOUNTER — Encounter: Payer: Self-pay | Admitting: Family Medicine

## 2016-05-10 ENCOUNTER — Ambulatory Visit (INDEPENDENT_AMBULATORY_CARE_PROVIDER_SITE_OTHER): Payer: BLUE CROSS/BLUE SHIELD | Admitting: Family Medicine

## 2016-05-10 VITALS — BP 163/103 | HR 113 | Temp 97.9°F | Resp 16 | Ht 66.0 in | Wt 126.5 lb

## 2016-05-10 DIAGNOSIS — R21 Rash and other nonspecific skin eruption: Secondary | ICD-10-CM | POA: Diagnosis not present

## 2016-05-10 DIAGNOSIS — L981 Factitial dermatitis: Secondary | ICD-10-CM | POA: Diagnosis not present

## 2016-05-10 DIAGNOSIS — B86 Scabies: Secondary | ICD-10-CM | POA: Diagnosis not present

## 2016-05-10 MED ORDER — PERMETHRIN 5 % EX CREA: TOPICAL_CREAM | CUTANEOUS | 0 refills | Status: DC

## 2016-05-10 MED ORDER — GABAPENTIN 100 MG PO CAPS: 100.0000 mg | ORAL_CAPSULE | Freq: Three times a day (TID) | ORAL | 1 refills | Status: DC

## 2016-05-10 NOTE — Progress Notes (Signed)
OFFICE VISIT  05/10/2016   CC:  Chief Complaint  Patient presents with  . Skin Problem   HPI:    Patient is a 46 y.o. Caucasian female who presents for itchy spots on skin that she continuously picks at. She walked into our office with a "bucket" full of various containers that she said had "bugs" in them that had come from her head or skin or both. She itches intensely, the lesions turn into pinkish-red papules/plaques that she excoriates.  These lesions are all over her body.  She says she saw a "bug" on her face last night, so she got very worried and rushed into our office at 4 o'clock this afternoon.  This has been a long term problem but pt not able to be clear on past w/u's or treatments, but the best I could put together today:  She was evaluated a few times for this over the last few years and was initially told she needed psych/counseling. [facility] derm MD named dr. Zara Council, approx 2013-14, pt states she was dx'd by him with vasculitis.  She was on dapsone and prednisone, and steroid creams for a while.  She felt better and then when that MD moved she stopped going to any MD for this.  Trying different OTC lotions but none help. Says no one else in home with similar lesions.   States she has several pets with fleas but when asked why she doesn't think she has flea bites she says "b/c my skin spots start out different".  Past Medical History:  Diagnosis Date  . Anemia    iron supplement added at the time  . Arthritis    Osteoarthritis (knees, hands)  . Bilateral chronic knee pain   . Chronic headache   . Chronic narcotic use   . Chronic neck pain   . Chronic pain of left upper extremity   . Chronic scapular pain   . Colon polyps   . Cutaneous vasculitis   . Dizziness   . Endometriosis   . Fibromyalgia 12/2010  . GERD (gastroesophageal reflux disease)   . Goiter   . Hypertension    with pain, once pain controlled no meds needed.  . IC (interstitial cystitis)   .  Neuromuscular disorder ([place])   . Pyodermic gangrenosum   . Raynaud's syndrome   . Sleep apnea   . Sleep apnea   . Tachycardia   . Thoracic outlet syndrome 2004  . Ulcer ([place])    from vasculitis  . Vasculitis ([place]) 03/2011  . 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 ABDOMINAL HYSTERECTOMY     TLH  . hemmorrhoidectomy    . OVARIAN CYST REMOVAL    . PELVIC LAPAROSCOPY  2007   RSO    Outpatient Medications Prior to Visit  Medication Sig Dispense Refill  . acetaminophen (TYLENOL) 500 MG tablet Take 1,000 mg by mouth 2 (two) times daily. For pain    . Aspirin-Caffeine (BAYER BACK & BODY PAIN EX ST) 500-32.5 MG TABS Take 1 tablet by mouth 3 (three) times a week. For pain    . busPIRone (BUSPAR) 15 MG tablet TAKE 1 TABLET BY MOUTH AS NEEDED FOR ANXIETY    . hydrOXYzine (ATARAX/VISTARIL) 50 MG tablet Take 1 tablet (50 mg total) by mouth at bedtime as needed. 90 tablet 1  . ibuprofen (ADVIL,MOTRIN) 200 MG tablet Take 600 mg by mouth every 6 (six) hours as needed for moderate  pain.    . levothyroxine (SYNTHROID, LEVOTHROID) 75 MCG tablet Take 75 mcg by mouth daily.    Marland Kitchen lidocaine (LIDODERM) 5 % Place 1 patch onto the skin daily as needed (FOR BACK).     Marland Kitchen liothyronine (CYTOMEL) 5 MCG tablet Take 5 mcg by mouth daily.     Marland Kitchen losartan (COZAAR) 100 MG tablet Take 100 mg by mouth daily.     . methadone (DOLOPHINE) 10 MG tablet Take 10 mg by mouth 4 (four) times daily.    Marland Kitchen MYDAYIS 50 MG CP24 TAKE ONE CAPSULE BY MOUTH EVERY MORNING FOR 7 DAYS  0  . omeprazole (PRILOSEC) 40 MG capsule TAKE 1 CAPSULE (40 MG TOTAL) BY MOUTH DAILY BEFORE DINNER.  2  . clindamycin (CLINDAGEL) 1 % gel Apply topically 2 (two) times daily. 30 g 5  . minocycline (MINOCIN) 50 MG capsule Take 1 capsule (50 mg total) by mouth 2 (two) times daily. 60 capsule 0  . triamcinolone cream (KENALOG) 0.1 % Apply 1 application topically 2 (two) times daily. 30 g 1   No  facility-administered medications prior to visit.     Allergies  Allergen Reactions  . Doxycycline Other (See Comments)    Hole in esophagus  . Tetracycline Other (See Comments)    Told not to take it since it's similar to Doxycylcine  . Bactrim Ds [Sulfamethoxazole-Trimethoprim] Nausea Only and Palpitations    Headache, skin crawling and weakness    ROS As per HPI  PE: Blood pressure (!) 163/103, pulse (!) 113, temperature 97.9 F (36.6 C), temperature source Oral, resp. rate 16, height 5\' 6"  (1.676 m), weight 126 lb 8 oz (57.4 kg), SpO2 96 %. Gen: Alert, well appearing.  Patient is oriented to person, place, time, and situation. AFFECT: nervous, hypomanic, talking rapidly, needs frequent redirection.  She has pretty lucid thought and speech.  She often repeats "I know I sound crazy" and "I joke about being OCD but this is ridiculous". Skin: she has scattered pinkish/erythematous excoriations from 2-3 mm up to 1 cm in size.  No surrounding erythema, no warmth, no nodularity or fluctuance.  She has many areas of hyperpigmentation throughout skin which appear to be where old lesions were.  LABS:  I looked at one of the "bugs" she found under the microscope and this was just skin cells clumped on each other.   IMPRESSION AND PLAN:  Chronic skin lesions, highly suspicious for psychogenic condition such as neurotic excoriations. Will treat for scabies with elimite cream.  Repeat treatment in 1 week.  Discussed necessary cleaning of clothes and bed items.  Therapeutic expectations and side effect profile of medication discussed today.  Patient's questions answered.  I told pt the lesions will not go away unless she stops touching/picking at them. Stop all other topical meds and stop minocycline.   Start gabapentin 100mg  tid to try to help with neurotic excoriations.  If tolerates this med I anticipate up-titrating it at next f/u visit.   Will try to get records from [facility] dermatology--the  place where she says she was dx'd with vasculitis. Will also ask for records from [place] [facility] [place], as well as [place] [facility]. Of note, patient states she has been dx'd with RA in the past, but we didn't get into this today.  An After Visit Summary was printed and given to the patient.  Spent 45 min with pt today, with >50% of this time spent in counseling and care coordination regarding the above  problems.  FOLLOW UP: Return in about 2 weeks (around 05/24/2016) for f/u skin.  Signed:  Crissie Sickles, MD           05/10/2016

## 2016-05-10 NOTE — Progress Notes (Signed)
Pre visit review using our clinic review tool, if applicable. No additional management support is needed unless otherwise documented below in the visit note. 

## 2016-05-11 ENCOUNTER — Telehealth: Payer: Self-pay | Admitting: Family Medicine

## 2016-05-11 NOTE — Telephone Encounter (Signed)
Left detailed message on cell vm, okay per DPR.  

## 2016-05-11 NOTE — Telephone Encounter (Signed)
OK, done

## 2016-05-11 NOTE — Telephone Encounter (Signed)
Patient's husband Shearon Clonch DOB 05/15/67 and daughter Somaly Marteney 07/09/95 need cream for scabies. [facility].

## 2016-05-11 NOTE — Telephone Encounter (Signed)
Please advise. Thanks. Both are pts of Dr. Dierdre Highman, [facility] DOB is 07[birth year]09[birth year].

## 2016-05-12 ENCOUNTER — Encounter (HOSPITAL_COMMUNITY): Payer: Self-pay

## 2016-05-12 ENCOUNTER — Emergency Department (HOSPITAL_COMMUNITY)
Admission: EM | Admit: 2016-05-12 | Discharge: 2016-05-12 | Disposition: A | Discharge status: Home / Self Care | Payer: BLUE CROSS/BLUE SHIELD | Attending: Emergency Medicine | Admitting: Emergency Medicine

## 2016-05-12 DIAGNOSIS — I10 Essential (primary) hypertension: Secondary | ICD-10-CM | POA: Insufficient documentation

## 2016-05-12 DIAGNOSIS — L299 Pruritus, unspecified: Secondary | ICD-10-CM | POA: Diagnosis present

## 2016-05-12 DIAGNOSIS — E039 Hypothyroidism, unspecified: Secondary | ICD-10-CM | POA: Diagnosis not present

## 2016-05-12 DIAGNOSIS — L988 Other specified disorders of the skin and subcutaneous tissue: Secondary | ICD-10-CM | POA: Diagnosis not present

## 2016-05-12 LAB — URINALYSIS, ROUTINE W REFLEX MICROSCOPIC
Bilirubin Urine: NEGATIVE
Glucose, UA: NEGATIVE mg/dL
Ketones, ur: 5 mg/dL — AB
Nitrite: NEGATIVE
Protein, ur: NEGATIVE mg/dL
Specific Gravity, Urine: 1.02 (ref 1.005–1.030)
pH: 6 (ref 5.0–8.0)

## 2016-05-12 NOTE — ED Notes (Signed)
Pt emphasized throughout triage that she is not crazy.

## 2016-05-12 NOTE — ED Provider Notes (Signed)
[facility] DEPT Provider Note   CSN: [id number] Arrival date & time: 05/12/16  2138     History   Chief Complaint Chief Complaint  Patient presents with  . Pruritis  . Skin Problem  . Insect Bite    HPI [name] is a 46 y.o. female.  HPI  11yF with trash. Pt feel there are bugs living inside her. She has found eggs in her face before. She tried showing me a home video on her ipad of what she claimed was eggs and bugs coming from her vagina. She says she has found eggs in her teeth. SHe has brought various samples with her. She would like blood work and a CT scan to identify what is inside her.   Past Medical History:  Diagnosis Date  . Anemia    iron supplement added at the time  . Arthritis    Osteoarthritis (knees, hands)  . Bilateral chronic knee pain   . Chronic headache   . Chronic narcotic use   . Chronic neck pain   . Chronic pain of left upper extremity   . Chronic scapular pain   . Colon polyps   . Cutaneous vasculitis   . Dizziness   . Endometriosis   . Fibromyalgia 12/2010  . GERD (gastroesophageal reflux disease)   . Goiter   . Hypertension    with pain, once pain controlled no meds needed.  . IC (interstitial cystitis)   . Neuromuscular disorder ([place])   . Pyodermic gangrenosum   . Raynaud's syndrome   . Sleep apnea   . Sleep apnea   . Tachycardia   . Thoracic outlet syndrome 2004  . Ulcer    from vasculitis  . Vasculitis ([place]) 03/2011  . Vasculitis limited to skin     Patient Active Problem List   Diagnosis Date Noted  . Skin infection 03/28/2016  . Acne 03/28/2016  . Compulsive skin picking 03/28/2016  . Essential hypertension, benign 02/03/2016  . Sleep apnea   . GERD (gastroesophageal reflux disease) 03/14/2015  . Inattention 02/08/2015  . Methadone use ([place]) 02/08/2015  . History of anemia 12/01/2014  . Hypothyroidism 12/01/2014  . Screening cholesterol level 12/01/2014  . Healthcare maintenance 12/01/2014  .  Osteoarthritis of multiple joints 12/01/2014  . History of colonic polyps 12/01/2014  . Fibromyalgia 06/13/2011  . Neck pain, chronic 05/14/2011  . Chronic scapular pain 05/14/2011  . Knee pain, bilateral 05/14/2011    Past Surgical History:  Procedure Laterality Date  . ABDOMINAL HYSTERECTOMY     [facility]  . hemmorrhoidectomy    . OVARIAN CYST REMOVAL    . PELVIC LAPAROSCOPY  2007   RSO    OB History    Gravida Para Term Preterm AB Living   3 2 2  0 1 2   SAB TAB Ectopic Multiple Live Births   1 0 0 0         Home Medications    Prior to Admission medications   Medication Sig Start Date End Date Taking? Authorizing Provider  acetaminophen (TYLENOL) 500 MG tablet Take 1,000 mg by mouth 2 (two) times daily. For pain    Historical Provider, MD  Aspirin-Caffeine (BAYER BACK & BODY PAIN EX ST) 500-32.5 MG TABS Take 1 tablet by mouth 3 (three) times a week. For pain    Historical Provider, MD  busPIRone (BUSPAR) 15 MG tablet TAKE 1 TABLET BY MOUTH AS NEEDED FOR ANXIETY 09/30/15   Historical Provider, MD  gabapentin (NEURONTIN) 100  MG capsule Take 1 capsule (100 mg total) by mouth 3 (three) times daily. 05/10/16   Tammi Sou, MD  hydrOXYzine (ATARAX/VISTARIL) 50 MG tablet Take 1 tablet (50 mg total) by mouth at bedtime as needed. 03/28/16   Renee A Kuneff, DO  ibuprofen (ADVIL,MOTRIN) 200 MG tablet Take 600 mg by mouth every 6 (six) hours as needed for moderate pain.    Historical Provider, MD  levothyroxine (SYNTHROID, LEVOTHROID) 75 MCG tablet Take 75 mcg by mouth daily.    Bindubal Balan, MD  lidocaine (LIDODERM) 5 % Place 1 patch onto the skin daily as needed (FOR BACK).  01/27/16   Historical Provider, MD  liothyronine (CYTOMEL) 5 MCG tablet Take 5 mcg by mouth daily.  05/28/13   Historical Provider, MD  losartan (COZAAR) 100 MG tablet Take 100 mg by mouth daily.  05/06/14   Historical Provider, MD  methadone (DOLOPHINE) 10 MG tablet Take 10 mg by mouth 4 (four) times daily.     Historical Provider, MD  MYDAYIS 50 MG CP24 TAKE ONE CAPSULE BY MOUTH EVERY MORNING FOR 7 DAYS 01/16/16   Historical Provider, MD  omeprazole (PRILOSEC) 40 MG capsule TAKE 1 CAPSULE (40 MG TOTAL) BY MOUTH DAILY BEFORE DINNER. 12/11/15   Historical Provider, MD  permethrin (ELIMITE) 5 % cream Apply cream from top of head to soles of your feet.  Wash off after 10 hours.  Repeat in 7d. 05/10/16   Tammi Sou, MD    Family History Family History  Problem Relation Age of Onset  . Diabetes Mother   . Hypertension Mother   . Cancer Mother 35    melanoma of the eye with mets    Social History Social History  Substance Use Topics  . Smoking status: Never Smoker  . Smokeless tobacco: Never Used  . Alcohol use No     Allergies   Doxycycline; Tetracycline; and Bactrim ds [sulfamethoxazole-trimethoprim]   Review of Systems Review of Systems  All systems reviewed and negative, other than as noted in HPI.  Physical Exam Updated Vital Signs BP (!) 178/113 (BP Location: Left Arm)   Pulse (!) 128   Temp 99 F (37.2 C) (Oral)   Resp 18   Wt 125 lb 1.6 oz (56.7 kg)   SpO2 100%   BMI 20.19 kg/m   Physical Exam  Constitutional: She appears well-developed and well-nourished. No distress.  HENT:  Head: Normocephalic and atraumatic.  Eyes: Conjunctivae are normal. Right eye exhibits no discharge. Left eye exhibits no discharge.  Neck: Neck supple.  Cardiovascular: Normal rate, regular rhythm and normal heart sounds.  Exam reveals no gallop and no friction rub.   No murmur heard. Pulmonary/Chest: Effort normal and breath sounds normal. No respiratory distress.  Abdominal: Soft. She exhibits no distension. There is no tenderness.  Musculoskeletal: She exhibits no edema or tenderness.  Neurological: She is alert.  Skin: Skin is warm and dry.  Pt with numerous lesions on her body in various stages of healing. Mostly scarred tissue. I did see any evidence of lice, fleas, bed bugs.  Lesions not typical of scabies. With a female nurse chaperone I did a visual inspection of her vagina and anus and did not see anything concerning.   Psychiatric: She has a normal mood and affect. Her behavior is normal. Thought content normal.  Nursing note and vitals reviewed.    ED Treatments / Results  Labs (all labs ordered are listed, but only abnormal results are displayed) Labs Reviewed  URINALYSIS, ROUTINE W REFLEX MICROSCOPIC - Abnormal; Notable for the following:       Result Value   APPearance HAZY (*)    Hgb urine dipstick SMALL (*)    Ketones, ur 5 (*)    Leukocytes, UA SMALL (*)    Bacteria, UA RARE (*)    Squamous Epithelial / LPF 6-30 (*)    All other components within normal limits    EKG  EKG Interpretation None       Radiology No results found.  Procedures Procedures (including critical care time)  Medications Ordered in ED Medications - No data to display   Initial Impression / Assessment and Plan / ED Course  I have reviewed the triage vital signs and the nursing notes.  Pertinent labs & imaging results that were available during my care of the patient were reviewed by me and considered in my medical decision making (see chart for details).     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 I tried to address her complaints in a manner that did not dismiss her concerns. I told her, truthfully, that I did not feel she had an emergent condition and that the help she needed was beyond what I could provide for her. I do not feel that any testing in the emergency room is going to benefit her significantly. Tried to reassure. Dermatology FU.   Final Clinical Impressions(s) / ED Diagnoses   Final diagnoses:  Morgellons disease    New Prescriptions New Prescriptions   No medications on file     Virgel Manifold, MD 05/22/16 1410

## 2016-05-12 NOTE — ED Triage Notes (Signed)
Pt states that she believes that there are bugs living inside of her. She was diagnosed with scabies by her PCP a couple weeks ago. She states that she has been using the prescribed cream, but she feels like they have migrated to her organs. She states that she squeezed eggs out of her pores, found them behind her teeth, and believes that here are larvae living around her anus. She states that her anus and vagina are swollen and itchy. She brought samples from home to show her provider. A&Ox4. Ambulatory.

## 2016-05-14 ENCOUNTER — Telehealth: Payer: Self-pay | Admitting: *Deleted

## 2016-05-14 ENCOUNTER — Emergency Department (HOSPITAL_COMMUNITY)
Admission: EM | Admit: 2016-05-14 | Discharge: 2016-05-14 | Disposition: A | Discharge status: Home / Self Care | Payer: BLUE CROSS/BLUE SHIELD | Attending: Emergency Medicine | Admitting: Emergency Medicine

## 2016-05-14 ENCOUNTER — Telehealth: Payer: Self-pay | Admitting: Family Medicine

## 2016-05-14 ENCOUNTER — Encounter (HOSPITAL_COMMUNITY): Payer: Self-pay | Admitting: Emergency Medicine

## 2016-05-14 ENCOUNTER — Ambulatory Visit: Payer: Self-pay | Admitting: Family Medicine

## 2016-05-14 DIAGNOSIS — R1110 Vomiting, unspecified: Secondary | ICD-10-CM | POA: Diagnosis not present

## 2016-05-14 DIAGNOSIS — R197 Diarrhea, unspecified: Secondary | ICD-10-CM | POA: Insufficient documentation

## 2016-05-14 DIAGNOSIS — Z79899 Other long term (current) drug therapy: Secondary | ICD-10-CM | POA: Insufficient documentation

## 2016-05-14 DIAGNOSIS — R1084 Generalized abdominal pain: Secondary | ICD-10-CM | POA: Insufficient documentation

## 2016-05-14 DIAGNOSIS — I10 Essential (primary) hypertension: Secondary | ICD-10-CM | POA: Diagnosis not present

## 2016-05-14 DIAGNOSIS — Z7982 Long term (current) use of aspirin: Secondary | ICD-10-CM | POA: Insufficient documentation

## 2016-05-14 DIAGNOSIS — Z5321 Procedure and treatment not carried out due to patient leaving prior to being seen by health care provider: Secondary | ICD-10-CM | POA: Insufficient documentation

## 2016-05-14 LAB — CBC
HCT: 39.8 % (ref 36.0–46.0)
Hemoglobin: 13.4 g/dL (ref 12.0–15.0)
MCH: 29.4 pg (ref 26.0–34.0)
MCHC: 33.7 g/dL (ref 30.0–36.0)
MCV: 87.3 fL (ref 78.0–100.0)
PLATELETS: 364 10*3/uL (ref 150–400)
RBC: 4.56 MIL/uL (ref 3.87–5.11)
RDW: 12.4 % (ref 11.5–15.5)
WBC: 7.9 10*3/uL (ref 4.0–10.5)

## 2016-05-14 LAB — COMPREHENSIVE METABOLIC PANEL
ALT: 32 U/L (ref 14–54)
AST: 28 U/L (ref 15–41)
Albumin: 4.3 g/dL (ref 3.5–5.0)
Alkaline Phosphatase: 46 U/L (ref 38–126)
Anion gap: 9 (ref 5–15)
BILIRUBIN TOTAL: 0.7 mg/dL (ref 0.3–1.2)
BUN: 15 mg/dL (ref 6–20)
CHLORIDE: 102 mmol/L (ref 101–111)
CO2: 26 mmol/L (ref 22–32)
CREATININE: 0.63 mg/dL (ref 0.44–1.00)
Calcium: 9.2 mg/dL (ref 8.9–10.3)
Glucose, Bld: 99 mg/dL (ref 65–99)
Potassium: 3.7 mmol/L (ref 3.5–5.1)
SODIUM: 137 mmol/L (ref 135–145)
TOTAL PROTEIN: 7.5 g/dL (ref 6.5–8.1)

## 2016-05-14 LAB — LIPASE, BLOOD: Lipase: 17 U/L (ref 11–51)

## 2016-05-14 MED ORDER — ONDANSETRON 4 MG PO TBDP
4.0000 mg | ORAL_TABLET | Freq: Once | ORAL | Status: AC | PRN
  Administered 2016-05-14: 4 mg via ORAL
  Filled 2016-05-14: qty 1

## 2016-05-14 NOTE — Telephone Encounter (Signed)
Patient called and left message stating she saw Dr Anitra Lauth last week because she has bugs living inside her and on her skin. She states the cream he Rx'd is helping some but she is requesting we scrap the bugs off her face and do a parasite test . She states if we cannot remove the bugs she wants [place] to send her somewhere where they can remove them. She states the ER can't help her she went there this weekend and they did nothing. Patient has a schedule appt here today.

## 2016-05-14 NOTE — Telephone Encounter (Signed)
Dr Raoul Pitch made aware

## 2016-05-14 NOTE — Telephone Encounter (Signed)
Pt went to ER. See ER note.

## 2016-05-14 NOTE — ED Notes (Signed)
Pt called to be roomed with no response.  RN notified. 

## 2016-05-14 NOTE — ED Triage Notes (Signed)
Pt reports she has had generalized abd pain and emesis since last night. Also has diarrhea.

## 2016-05-14 NOTE — Telephone Encounter (Signed)
PLEASE NOTE: All timestamps contained within this report are represented as [place] Standard Time. CONFIDENTIALTY NOTICE: This fax transmission is intended only for the addressee. It contains information that is legally privileged, confidential or otherwise protected from use or disclosure. If you are not the intended recipient, you are strictly prohibited from reviewing, disclosing, copying using or disseminating any of this information or taking any action in reliance on or regarding this information. If you have received this fax in error, please notify us immediately by telephone so that we can arrange for its return to [place]. Phone: [phone], Toll-Free: [phone], Fax: [phone] Page: 1 of 2 Call Id: [id number] [place] Patient Name: [name] Gender: Female DOB: 10/12/1970 Age: 46 Y 18 D Return Phone Number: [phone] (Primary) City/State/Zip: [place] [address] Client [facility] - Client Client Site [facility] Physician Crissie Sickles - MD Who Is Calling Patient / Member / Family / Caregiver Call Type Triage / Clinical Caller Name Anushri Casalino Relationship To Patient Self Return Phone Number [phone] (Primary) Chief Complaint Vomiting Reason for Call Symptomatic / Request for [place] states that she was diagnosed on thursday with scabies, needs to make sure that she actually has them or if there is another mite that resembles them, cream is working, but one of her symptoms has what looks like eggs when she blows her nose and she thinks she can see them in her stool. Has vomiting. Nurse Assessment Nurse: Cherre Robins, RN, Ria Comment Date/Time (Eastern Time): 05/12/2016 3:10:59 PM Confirm and document reason for call. If symptomatic, describe symptoms. ---Caller states she was dx'd  scabies on Thursday after bringing her "fake teeth" where she can see tiny eggs in the cracks of her teeth and is now being treated for scabies and states she sees eggs in her "genital area" and "anus". Caller states she used the cream and now she see bugs on the surface of her skin and caller states what is concerning her is that she read that that scabies cannot enter the body and now she has seen eggs in her stool now too. Does the PT have any chronic conditions? (i.e. diabetes, asthma, etc.) ---Yes List chronic conditions. ---vasculitis, hypothyroid, arthritis- rhematoid and osteo, fribromyalgia, adhd, htn Is the patient pregnant or possibly pregnant? (Ask all females between the ages of 22-55) ---No Guidelines Guideline Title Affirmed Question Scabies Follow-up Call Patient sounds very sick or weak to the triager Disp. Time Eilene Ghazi Time) Disposition Final User 05/12/2016 3:25:14 PM Go to ED Now (or PCP triage) Yes Weiss-Hilton, RN, Ria Comment Referrals GO TO FACILITY UNDECIDED Care Advice Given Per Guideline PLEASE NOTE: All timestamps contained within this report are represented as [place] Standard Time. CONFIDENTIALTY NOTICE: This fax transmission is intended only for the addressee. It contains information that is legally privileged, confidential or otherwise protected from use or disclosure. If you are not the intended recipient, you are strictly prohibited from reviewing, disclosing, copying using or disseminating any of this information or taking any action in reliance on or regarding this information. If you have received this fax in error, please notify us immediately by telephone so that we can arrange for its return to [place]. Phone: [phone], Toll-Free: [phone], Fax: [phone] Page: 2 of 2 Call Id: [id number] Care Advice Given Per Guideline GO TO ED NOW (OR PCP TRIAGE): * IF NO PCP TRIAGE: You need to  be seen. Go to the [facility] within the  next hour. Leave as soon as you can. BRING MEDICINES: * Please bring a list of your current medicines when you go to the Emergency Department (ER). * It is also a good idea to bring the pill bottles too. This will help the doctor to make certain you are taking the right medicines and the right dose. CARE ADVICE given per Scabies Follow-up Call (Adult) guideline. Comments User: Carolan Clines, RN Date/Time Eilene Ghazi Time): 05/12/2016 3:26:34 PM caller seems very sick to triager bc she is c/o that she is seeing eggs in her stool, and in her teeth and coming from her nose when she blows her nose.

## 2016-05-14 NOTE — Telephone Encounter (Signed)
Patient called stating that she was throwing up and was sorry to have missed her appointment.  She states she is taking the "little bug from her stool" and driving it to [facility] and then she hung up on me as I tried to offer her a new appointment at a later date.

## 2016-05-14 NOTE — ED Notes (Signed)
Pt called x2 to be roomed with no response.  Rn notified.

## 2016-05-14 NOTE — Telephone Encounter (Signed)
Noted  

## 2016-05-16 ENCOUNTER — Encounter: Payer: Self-pay | Admitting: *Deleted

## 2016-05-16 ENCOUNTER — Telehealth: Payer: Self-pay | Admitting: Family Medicine

## 2016-05-16 NOTE — Telephone Encounter (Signed)
Patient Name: [name] DOB: 11/18/1970 Initial Comment Caller states she has sores on her skin, has lost 38 lbs since Jan. She has abd pain, vomiting and diarrhea. And she saw something in her stool. Nurse Assessment Nurse: Sherrell Puller, RN, Amy Date/Time Eilene Ghazi Time): 05/16/2016 9:12:38 AM Confirm and document reason for call. If symptomatic, describe symptoms. ---Caller states she's just needing a referral to an infectious disease doctor. She says she's already seen her PCP and was told she needs a Parasite test and they don't have the capability to do it there. She declined triage and says the office may have misunderstood that she did not need to speak to a nurse but needs to speak to someone about a referral. She said she is okay today but her symptoms do come and go. This nurse advised she call the office back and let them know she is not needing to speak to a nurse and was already seen in the office and told she needs testing done that they don't have there. The patient says she will call back. She says she may have not given them all the information when she called first. Does the patient have any new or worsening symptoms? ---No Guidelines Guideline Title Affirmed Question Affirmed Notes Final Disposition User Clinical Call Sherrell Puller, RN, Amy

## 2016-05-16 NOTE — Telephone Encounter (Signed)
Patient has not been seen recently by her PCP Dr Raoul Pitch she was given an appt for evaluation of her symptoms for 05/14/16 and no showed she later called and stated she was going to [facility] for evaluation. Patient will need an appt for evaluation to determine appropriate treatment.

## 2016-05-17 NOTE — Telephone Encounter (Signed)
Patient is going to transfer care to [facility]. Will come by our office to sign record release. Patient would like the records to be released to her. Advised patient that request will be sent to our medical records office. Patient states that she would like to have "hair pulling behavior" removed from her chart.

## 2016-05-17 NOTE — Telephone Encounter (Signed)
Correction: patient was contacted via MyChart but it was unread.

## 2016-05-17 NOTE — Telephone Encounter (Signed)
Nobody contact the patient via this note.  Patient contacted [place] today and stated that she wanted this referral and I explained to her that we would not be able to do it at this time per instruction.  She stated that she was "very disappointed with how she is being treated at our office" and that she knows the CDC has already "stamped [place] with red flags" I then tried to transfer her to our office managers voice mail and she refused and continued telling me how terrible our office is and that Dr Anitra Lauth saw her last and that he needs to do this referral.  I then interrupted her to tell her I was transferring her to the manager VM.

## 2016-05-17 NOTE — Telephone Encounter (Signed)
Received a VM from patent. Attempted to call her back but there was no answer and her VM is full.

## 2016-05-18 NOTE — Telephone Encounter (Signed)
Noted.  - Just FYI: "hair pulling behavior" is not on her problem list that can be seen by this Provider. Nor would any problem be removed by request, as problems are part of the patients medical history.

## 2016-06-20 ENCOUNTER — Other Ambulatory Visit: Payer: Self-pay

## 2016-06-20 ENCOUNTER — Encounter (HOSPITAL_COMMUNITY): Payer: Self-pay | Admitting: Nurse Practitioner

## 2016-06-20 ENCOUNTER — Emergency Department (HOSPITAL_COMMUNITY)
Admission: EM | Admit: 2016-06-20 | Discharge: 2016-06-20 | Disposition: A | Discharge status: Home / Self Care | Payer: BLUE CROSS/BLUE SHIELD | Attending: Emergency Medicine | Admitting: Emergency Medicine

## 2016-06-20 DIAGNOSIS — R21 Rash and other nonspecific skin eruption: Secondary | ICD-10-CM | POA: Diagnosis present

## 2016-06-20 DIAGNOSIS — R079 Chest pain, unspecified: Secondary | ICD-10-CM

## 2016-06-20 DIAGNOSIS — E039 Hypothyroidism, unspecified: Secondary | ICD-10-CM | POA: Diagnosis not present

## 2016-06-20 DIAGNOSIS — I10 Essential (primary) hypertension: Secondary | ICD-10-CM | POA: Diagnosis not present

## 2016-06-20 DIAGNOSIS — R1013 Epigastric pain: Secondary | ICD-10-CM | POA: Insufficient documentation

## 2016-06-20 DIAGNOSIS — R0789 Other chest pain: Secondary | ICD-10-CM | POA: Diagnosis not present

## 2016-06-20 DIAGNOSIS — B89 Unspecified parasitic disease: Secondary | ICD-10-CM | POA: Insufficient documentation

## 2016-06-20 DIAGNOSIS — Z79899 Other long term (current) drug therapy: Secondary | ICD-10-CM | POA: Diagnosis not present

## 2016-06-20 DIAGNOSIS — Z7982 Long term (current) use of aspirin: Secondary | ICD-10-CM | POA: Diagnosis not present

## 2016-06-20 DIAGNOSIS — F22 Delusional disorders: Secondary | ICD-10-CM | POA: Insufficient documentation

## 2016-06-20 LAB — CBC
HCT: 39.1 % (ref 36.0–46.0)
Hemoglobin: 13 g/dL (ref 12.0–15.0)
MCH: 30.1 pg (ref 26.0–34.0)
MCHC: 33.2 g/dL (ref 30.0–36.0)
MCV: 90.5 fL (ref 78.0–100.0)
PLATELETS: 338 10*3/uL (ref 150–400)
RBC: 4.32 MIL/uL (ref 3.87–5.11)
RDW: 12.5 % (ref 11.5–15.5)
WBC: 5.6 10*3/uL (ref 4.0–10.5)

## 2016-06-20 LAB — COMPREHENSIVE METABOLIC PANEL
ALBUMIN: 4.3 g/dL (ref 3.5–5.0)
ALK PHOS: 42 U/L (ref 38–126)
ALT: 25 U/L (ref 14–54)
AST: 30 U/L (ref 15–41)
Anion gap: 11 (ref 5–15)
BILIRUBIN TOTAL: 0.6 mg/dL (ref 0.3–1.2)
BUN: 17 mg/dL (ref 6–20)
CALCIUM: 9 mg/dL (ref 8.9–10.3)
CO2: 24 mmol/L (ref 22–32)
Chloride: 102 mmol/L (ref 101–111)
Creatinine, Ser: 0.71 mg/dL (ref 0.44–1.00)
GFR calc Af Amer: >60 mL/min (ref >60)
GFR calc non Af Amer: >60 mL/min (ref >60)
GLUCOSE: 97 mg/dL (ref 65–99)
Potassium: 3.4 mmol/L — ABNORMAL LOW (ref 3.5–5.1)
Sodium: 137 mmol/L (ref 135–145)
Total Protein: 7.2 g/dL (ref 6.5–8.1)

## 2016-06-20 LAB — I-STAT TROPONIN, ED: Troponin i, poc: 0 ng/mL (ref 0.00–0.08)

## 2016-06-20 MED ORDER — PERMETHRIN 5 % EX CREA: TOPICAL_CREAM | CUTANEOUS | 1 refills | Status: DC

## 2016-06-20 NOTE — ED Notes (Signed)
ED Provider at bedside. 

## 2016-06-20 NOTE — Discharge Instructions (Signed)
Take your Prilosec twice a day for the next two weeks.  I have looked at the items you brought, and did not see any bugs, larvae or pupae. Please see the specialists you have made appointments to see. Consider having a dermatologist do a skin biopsy. If the veterinarian is able to identify a parasite, then treatment could be aimed at that specific parasite. However, please also consider a psychiatric cause of your symptoms. Please make an appointment with the psychiatrist while continuing your efforts to find the cause of the lesions.

## 2016-06-20 NOTE — ED Provider Notes (Signed)
[facility] DEPT Provider Note   CSN: [id number] Arrival date & time: 06/20/16  1734     History   Chief Complaint Chief Complaint  Patient presents with  . Abdominal Pain  . Chest Pain  . Rash    HPI [name] is a 46 y.o. female.  The history is provided by the patient.  She comes in complaining of a rash for the last 5 months which is getting worse. She has noted bugs and live a coming out from lesions on her skin. She has seen her PCP, and a dermatologist who did not feel that there are actually bugs there. She states that at one point, she did treat herself with permethrin which gave some temporary relief, but did not pursue a second course. Her daughter and also her have had similar problems. She has taken her to her veterinarian who took some specimens and is trying to find an answer. She saw an infectious disease person who felt that she had delusional parasitosis. She is insistent that the bugs are coming out of her skin lesions. She showed me multiple pictures of her lesions. She is also seeing bugs in her stool and her child's stool. She brought in a stool sample to show me. Has a secondary complaint, she has been having waxing and waning upper abdominal pain and chest pain over the last month. She has difficulty describing the pain. Nothing seems to make it better and nothing seems to make it worse. She does have history of GERD and is on Prilosec.  Past Medical History:  Diagnosis Date  . Anemia    iron supplement added at the time  . Arthritis    Osteoarthritis (knees, hands)  . Bilateral chronic knee pain   . Chronic headache   . Chronic narcotic use   . Chronic neck pain   . Chronic pain of left upper extremity   . Chronic scapular pain   . Colon polyps   . Cutaneous vasculitis   . Dizziness   . Endometriosis   . Fibromyalgia 12/2010  . GERD (gastroesophageal reflux disease)   . Goiter   . Hypertension    with pain, once pain controlled no meds  needed.  . IC (interstitial cystitis)   . Neuromuscular disorder ([place])   . Pyodermic gangrenosum   . Raynaud's syndrome   . Sleep apnea   . Sleep apnea   . Tachycardia   . Thoracic outlet syndrome 2004  . Ulcer    from vasculitis  . Vasculitis ([place]) 03/2011  . Vasculitis limited to skin     Patient Active Problem List   Diagnosis Date Noted  . Skin infection 03/28/2016  . Acne 03/28/2016  . Compulsive skin picking 03/28/2016  . Essential hypertension, benign 02/03/2016  . Sleep apnea   . GERD (gastroesophageal reflux disease) 03/14/2015  . Inattention 02/08/2015  . Methadone use ([place]) 02/08/2015  . History of anemia 12/01/2014  . Hypothyroidism 12/01/2014  . Screening cholesterol level 12/01/2014  . Healthcare maintenance 12/01/2014  . Osteoarthritis of multiple joints 12/01/2014  . History of colonic polyps 12/01/2014  . Fibromyalgia 06/13/2011  . Neck pain, chronic 05/14/2011  . Chronic scapular pain 05/14/2011  . Knee pain, bilateral 05/14/2011    Past Surgical History:  Procedure Laterality Date  . ABDOMINAL HYSTERECTOMY     TLH  . hemmorrhoidectomy    . OVARIAN CYST REMOVAL    . PELVIC LAPAROSCOPY  2007   RSO    OB History  Gravida Para Term Preterm AB Living   3 2 2  0 1 2   SAB TAB Ectopic Multiple Live Births   1 0 0 0         Home Medications    Prior to Admission medications   Medication Sig Start Date End Date Taking? Authorizing Provider  acetaminophen (TYLENOL) 500 MG tablet Take 1,000 mg by mouth 2 (two) times daily. For pain    [provider]  Aspirin-Caffeine (BAYER BACK & BODY PAIN EX ST) 500-32.5 MG TABS Take 1 tablet by mouth 3 (three) times a week. For pain    [provider]  busPIRone (BUSPAR) 15 MG tablet TAKE 1 TABLET BY MOUTH AS NEEDED FOR ANXIETY 09/30/15   [provider]  gabapentin (NEURONTIN) 100 MG capsule Take 1 capsule (100 mg total) by mouth 3 (three) times daily. 05/10/16   McGowen, Adrian Blackwater, MD   hydrOXYzine (ATARAX/VISTARIL) 50 MG tablet Take 1 tablet (50 mg total) by mouth at bedtime as needed. 03/28/16   Kuneff, Renee A, DO  ibuprofen (ADVIL,MOTRIN) 200 MG tablet Take 600 mg by mouth every 6 (six) hours as needed for moderate pain.    [provider]  levothyroxine (SYNTHROID, LEVOTHROID) 75 MCG tablet Take 75 mcg by mouth daily.    Balan, Bindubal, MD  lidocaine (LIDODERM) 5 % Place 1 patch onto the skin daily as needed (FOR BACK).  01/27/16   [provider]  liothyronine (CYTOMEL) 5 MCG tablet Take 5 mcg by mouth daily.  05/28/13   [provider]  losartan (COZAAR) 100 MG tablet Take 100 mg by mouth daily.  05/06/14   [provider]  methadone (DOLOPHINE) 10 MG tablet Take 10 mg by mouth 4 (four) times daily.    [provider]  MYDAYIS 50 MG CP24 TAKE ONE CAPSULE BY MOUTH EVERY MORNING FOR 7 DAYS 01/16/16   [provider]  omeprazole (PRILOSEC) 40 MG capsule TAKE 1 CAPSULE (40 MG TOTAL) BY MOUTH DAILY BEFORE DINNER. 12/11/15   [provider]  permethrin (ELIMITE) 5 % cream Apply cream from top of head to soles of your feet.  Wash off after 10 hours.  Repeat in 7d. 6/56/81   Delora Fuel, MD    Family History Family History  Problem Relation Age of Onset  . Diabetes Mother   . Hypertension Mother   . Cancer Mother 2       melanoma of the eye with mets    Social History Social History  Substance Use Topics  . Smoking status: Never Smoker  . Smokeless tobacco: Never Used  . Alcohol use No     Allergies   Doxycycline; Tetracycline; and Bactrim ds [sulfamethoxazole-trimethoprim]   Review of Systems Review of Systems  All other systems reviewed and are negative.    Physical Exam Updated Vital Signs BP (!) 179/107   Pulse (!) 104   Temp 98 F (36.7 C) (Oral)   Resp 19   SpO2 100%   Physical Exam  Nursing note and vitals reviewed.  46 year old female, resting comfortably and in no acute  distress. Vital signs are significant for tachycardia and hypertension. Oxygen saturation is 100%, which is normal. Head is normocephalic and atraumatic. PERRLA, EOMI. Oropharynx is clear. Neck is nontender and supple without adenopathy or JVD. Back is nontender and there is no CVA tenderness. Lungs are clear without rales, wheezes, or rhonchi. Chest is nontender. Heart has regular rate and rhythm without murmur. Abdomen  is soft, flat, with mild to moderate epigastric tenderness. There is no rebound or guarding. There are no masses or hepatosplenomegaly and peristalsis is normoactive. Extremities have no cyanosis or edema, full range of motion is present. Skin has multiple excoriated areas with mild to moderate scabbing. Neurologic: She is awake, alert, oriented, cranial nerves are intact, there are no motor or sensory deficits. Psychiatric: She is hypomanic with some tendency towards flight of ideas. When confronted about her probable diagnosis, she becomes emotionally labile.  ED Treatments / Results  Labs (all labs ordered are listed, but only abnormal results are displayed) Labs Reviewed  COMPREHENSIVE METABOLIC PANEL - Abnormal; Notable for the following:       Result Value   Potassium 3.4 (*)    All other components within normal limits  CBC  I-STAT TROPOININ, ED    EKG  EKG Interpretation  Date/Time:  Wednesday Jun 20 2016 19:35:18 EDT Ventricular Rate:  88 PR Interval:  126 QRS Duration: 101 QT Interval:  386 QTC Calculation: 467 R Axis:   62 Text Interpretation:  Sinus rhythm Right atrial enlargement Consider right ventricular hypertrophy When compared with ECG of 09/05/2011, No significant change was found Confirmed by Delora Fuel ([id number]) on 06/20/2016 7:37:56 PM       Procedures Procedures (including critical care time)  Medications Ordered in ED Medications - No data to display   Initial Impression / Assessment and Plan / ED Course  I have reviewed the triage  vital signs and the nursing notes.  Pertinent lab results that were available during my care of the patient were reviewed by me and considered in my medical decision making (see chart for details).  Skin lesions with patient insisting that she has parasites. This is consistent with delusional parasitosis. I've inspected the lesions that she says have parasites in them. I have also inspected several specimens that she brought in with her including her stool sample. Have looked at all the pictures that she took. She is seeing faces in things that are nothing more than strips of dead skin. I see nothing that would indicate actual parasitosis. However, given her response to permethrin, I have agreed to give her a prescription for permethrin to see if she does get some relief. I reviewed records in care every where, and she was seen by infectious disease specialist who also felt that she had delusional parasitosis. She is strongly encouraged to have a psychiatric evaluation while continuing her surgeon for the cause of her problems. Regarding her abdominal pain, this does appear to be an exacerbation of her GERD and she is advised to increase her omeprazole to twice a day for 2 weeks. Chest pain seems most likely to be part of her abdominal pain syndrome.  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

## 2016-06-20 NOTE — ED Triage Notes (Addendum)
Pt presents with c/o abdominal and chest pain and skin rash. She reports a history of chronic vasculitis with skin lesions. Over the past half year or more she has noticed what appears to be bugs coming from the lesions. She also reports what appears to be bugs in her stool. She has noticed the same thing in her grandchild and pets stool. She has taken her pets to veterinary for stool samples which she is awaiting results and was also seen at [facility] recently with a negative stool sample.  She reports multiple visits to multiple doctors for these complaints with no diagnosis. she decided to come to [facility] today for a second opinion and also for evaluation of abdominal pain and discomfort in her chest that began this week.

## 2016-08-23 ENCOUNTER — Telehealth: Payer: Self-pay | Admitting: Family Medicine

## 2016-08-23 ENCOUNTER — Ambulatory Visit: Payer: Self-pay | Admitting: Family Medicine

## 2016-08-23 NOTE — Telephone Encounter (Signed)
Patient's sister [place] called in upset that we did not see her sister this morning. She states she was 8 minutes late and that we turned her away "because we think she is delusional". She states she is not delusional, her son has also developed the same spots and has circles under his eyes & that he has an appointment at [facility] in August for this. She mentioned another family member may have it too. [place] then said to watch out that she was calling [employer] and reporting [place].  I advised [place] that I could not release any information due to HIPPA but that all patients at our location are advised to reschedule their appointments if they are 10 minutes late.

## 2016-08-23 NOTE — Telephone Encounter (Signed)
OK to proceed with dismissal based on a fractured doctor/patient relationship?

## 2016-08-23 NOTE — Telephone Encounter (Signed)
Patient arrived to office today at 8:58am for her scheduled 8:45am appointment.  I informed patient she would need to reschedule her appointment due to arriving late.  I offered to reschedule her appt to another day however she declined to reschedule.  Patient became upset and walked out of the door stating this was not fair.

## 2016-08-24 ENCOUNTER — Encounter: Payer: Self-pay | Admitting: Family Medicine

## 2016-08-24 ENCOUNTER — Telehealth: Payer: Self-pay | Admitting: Family Medicine

## 2016-08-24 ENCOUNTER — Telehealth: Payer: Self-pay | Admitting: *Deleted

## 2016-08-24 NOTE — Telephone Encounter (Signed)
Pt called and left message in triage voicemail stating she needs help from Dr.Fontaine about who to see about parasites, has seen the infectious disease doctors and no help, states no PCP. I called pt back this am and received her voicemail explaining to her that Dr.Fontaine is GYN and this isn't a issues her could he could help her with, she is seeing the infectious disease physician which is the correct doctor to follow up with.

## 2016-08-24 NOTE — Telephone Encounter (Signed)
Patient dismissed from [facility] by Howard Pouch DO , effective August 24, 2016. Dismissal letter sent out by certified / registered mail.  daj

## 2016-08-24 NOTE — Telephone Encounter (Signed)
Patient has been verbally abusive to staff on many occasions. She has no showed and canceled same day on at least 3 occasions. It is okay to dismiss this patient on those basis.

## 2016-08-31 NOTE — Telephone Encounter (Signed)
Received signed domestic return receipt verifying delivery of certified letter on August 28, 2016. Article number [id number]

## 2016-09-01 ENCOUNTER — Emergency Department (HOSPITAL_COMMUNITY)
Admission: EM | Admit: 2016-09-01 | Discharge: 2016-09-01 | Disposition: A | Discharge status: Home / Self Care | Payer: BLUE CROSS/BLUE SHIELD | Attending: Emergency Medicine | Admitting: Emergency Medicine

## 2016-09-01 ENCOUNTER — Emergency Department (HOSPITAL_COMMUNITY): Payer: BLUE CROSS/BLUE SHIELD

## 2016-09-01 ENCOUNTER — Encounter (HOSPITAL_COMMUNITY): Payer: Self-pay | Admitting: Emergency Medicine

## 2016-09-01 DIAGNOSIS — I10 Essential (primary) hypertension: Secondary | ICD-10-CM | POA: Diagnosis not present

## 2016-09-01 DIAGNOSIS — Z7982 Long term (current) use of aspirin: Secondary | ICD-10-CM | POA: Insufficient documentation

## 2016-09-01 DIAGNOSIS — E039 Hypothyroidism, unspecified: Secondary | ICD-10-CM | POA: Insufficient documentation

## 2016-09-01 DIAGNOSIS — Z79899 Other long term (current) drug therapy: Secondary | ICD-10-CM | POA: Diagnosis not present

## 2016-09-01 DIAGNOSIS — R51 Headache: Secondary | ICD-10-CM | POA: Diagnosis present

## 2016-09-01 DIAGNOSIS — R21 Rash and other nonspecific skin eruption: Secondary | ICD-10-CM | POA: Insufficient documentation

## 2016-09-01 MED ORDER — PERMETHRIN 5 % EX CREA: TOPICAL_CREAM | CUTANEOUS | 0 refills | Status: DC

## 2016-09-01 MED ORDER — LIDOCAINE 5 % EX OINT: 1.0000 "application " | TOPICAL_OINTMENT | CUTANEOUS | 0 refills | Status: AC | PRN

## 2016-09-01 MED ORDER — ACETAMINOPHEN 325 MG PO TABS: 650.0000 mg | ORAL_TABLET | Freq: Once | ORAL | Status: DC

## 2016-09-01 NOTE — Discharge Instructions (Addendum)
Please read attached information. If you experience any new or worsening signs or symptoms please return to the emergency room for evaluation. Please follow-up with your primary care provider or specialist as discussed. Please use medication prescribed only as directed and discontinue taking if you have any concerning signs or symptoms.   °

## 2016-09-01 NOTE — ED Triage Notes (Signed)
Pt states she has been sick since the beginning of this year with rashes and skin problems that she cannot get anyone to take seriously  Pt states she has parasites in her skin  Pt states she has lost 55 lbs since the beginning of the year  Pt is c/o joint pain and inflammation  Pt states she has sores that will not heal  Pt states she has used prometherin cream x 3 times and it helps but does not get rid of the problem  Pt states she took Albenza without relief  Pt states today she attempted to pull something out of one of the places on the side of her face and she could feel it moving across the top of her skull to the other side so she stopped pulling but now she feels whatever it is is half out and half in her head  Pt is c/o headaches

## 2016-09-01 NOTE — ED Provider Notes (Signed)
[facility] DEPT Provider Note   CSN: [id number] Arrival date & time: 09/01/16  0000     History   Chief Complaint Chief Complaint  Patient presents with  . Headache  . Rash    HPI SHAI RASMUSSEN is a 46 y.o. female.  HPI   46 year old female presents today with complaints of headache and rash. Patient notes a 8 month history of rash. She notes that she has things coming out of her skin at various stages. She reports that these are also noted on her granddaughter, sister, and nephew. She's been seen numerous times for this with no significant findings after several biopsies, infectious disease, dermatology, rheumatology, and primary care providers. Patient notes she was placed on oral medication that did not seem to improve her symptoms. She also has used permethrin which seemed to calm her symptoms but not resolve them. Patient denies any generalized illnesses, but reports she is extremely fatigued. Patient notes she's been having intermittent severe headaches.   Past Medical History:  Diagnosis Date  . Anemia    iron supplement added at the time  . Arthritis    Osteoarthritis (knees, hands)  . Bilateral chronic knee pain   . Chronic headache   . Chronic narcotic use   . Chronic neck pain   . Chronic pain of left upper extremity   . Chronic scapular pain   . Colon polyps   . Cutaneous vasculitis   . Dizziness   . Endometriosis   . Fibromyalgia 12/2010  . GERD (gastroesophageal reflux disease)   . Goiter   . Hypertension    with pain, once pain controlled no meds needed.  . IC (interstitial cystitis)   . Neuromuscular disorder ([place])   . Pyodermic gangrenosum   . Raynaud's syndrome   . Sleep apnea   . Sleep apnea   . Tachycardia   . Thoracic outlet syndrome 2004  . Ulcer    from vasculitis  . Vasculitis ([place]) 03/2011  . Vasculitis limited to skin     Patient Active Problem List   Diagnosis Date Noted  . Skin infection 03/28/2016  . Acne 03/28/2016  .  Compulsive skin picking 03/28/2016  . Essential hypertension, benign 02/03/2016  . Sleep apnea   . GERD (gastroesophageal reflux disease) 03/14/2015  . Inattention 02/08/2015  . Methadone use ([place]) 02/08/2015  . History of anemia 12/01/2014  . Hypothyroidism 12/01/2014  . Screening cholesterol level 12/01/2014  . Healthcare maintenance 12/01/2014  . Osteoarthritis of multiple joints 12/01/2014  . History of colonic polyps 12/01/2014  . Fibromyalgia 06/13/2011  . Neck pain, chronic 05/14/2011  . Chronic scapular pain 05/14/2011  . Knee pain, bilateral 05/14/2011    Past Surgical History:  Procedure Laterality Date  . ABDOMINAL HYSTERECTOMY     TLH  . hemmorrhoidectomy    . OVARIAN CYST REMOVAL    . PELVIC LAPAROSCOPY  2007   RSO    OB History    Gravida Para Term Preterm AB Living   3 2 2  0 1 2   SAB TAB Ectopic Multiple Live Births   1 0 0 0         Home Medications    Prior to Admission medications   Medication Sig Start Date End Date Taking? Authorizing Provider  acetaminophen (TYLENOL) 500 MG tablet Take 1,000 mg by mouth 2 (two) times daily. For pain    [provider]  Aspirin-Caffeine (BAYER BACK & BODY PAIN EX ST) 500-32.5 MG TABS Take 1  tablet by mouth 3 (three) times a week. For pain    [provider]  busPIRone (BUSPAR) 15 MG tablet TAKE 1 TABLET BY MOUTH AS NEEDED FOR ANXIETY 09/30/15   [provider]  gabapentin (NEURONTIN) 100 MG capsule Take 1 capsule (100 mg total) by mouth 3 (three) times daily. 05/10/16   McGowen, Adrian Blackwater, MD  hydrOXYzine (ATARAX/VISTARIL) 50 MG tablet Take 1 tablet (50 mg total) by mouth at bedtime as needed. 03/28/16   Kuneff, Renee A, DO  ibuprofen (ADVIL,MOTRIN) 200 MG tablet Take 600 mg by mouth every 6 (six) hours as needed for moderate pain.    [provider]  levothyroxine (SYNTHROID, LEVOTHROID) 75 MCG tablet Take 75 mcg by mouth daily.    Jacelyn Pi, MD  lidocaine (XYLOCAINE) 5 %  ointment Apply 1 application topically as needed. 09/01/16   Jennfier Abdulla, Dellis Filbert, PA-C  liothyronine (CYTOMEL) 5 MCG tablet Take 5 mcg by mouth daily.  05/28/13   [provider]  losartan (COZAAR) 100 MG tablet Take 100 mg by mouth daily.  05/06/14   [provider]  methadone (DOLOPHINE) 10 MG tablet Take 10 mg by mouth 4 (four) times daily.    [provider]  MYDAYIS 50 MG CP24 TAKE ONE CAPSULE BY MOUTH EVERY MORNING FOR 7 DAYS 01/16/16   [provider]  omeprazole (PRILOSEC) 40 MG capsule TAKE 1 CAPSULE (40 MG TOTAL) BY MOUTH DAILY BEFORE DINNER. 12/11/15   [provider]  permethrin (ELIMITE) 5 % cream Apply to affected area once 09/01/16   Quanasia Defino, Dellis Filbert, PA-C    Family History Family History  Problem Relation Age of Onset  . Diabetes Mother   . Hypertension Mother   . Cancer Mother 24       melanoma of the eye with mets    Social History Social History  Substance Use Topics  . Smoking status: Never Smoker  . Smokeless tobacco: Never Used  . Alcohol use No     Allergies   Doxycycline; Tetracycline; and Bactrim ds [sulfamethoxazole-trimethoprim]   Review of Systems Review of Systems  All other systems reviewed and are negative.    Physical Exam Updated Vital Signs BP (!) 156/95 (BP Location: Right Arm)   Pulse 77   Temp 98 F (36.7 C) (Oral)   Resp 16   SpO2 100%   Physical Exam  Constitutional: She is oriented to person, place, and time. She appears well-developed and well-nourished.  HENT:  Head: Normocephalic and atraumatic.  Eyes: Pupils are equal, round, and reactive to light. Conjunctivae are normal. Right eye exhibits no discharge. Left eye exhibits no discharge. No scleral icterus.  Neck: Normal range of motion. No JVD present. No tracheal deviation present.  Pulmonary/Chest: Effort normal. No stridor.  Neurological: She is alert and oriented to person, place, and time. Coordination normal.  Skin:  Numerous  areas of skin excoriation and source throughout face neck chest back upper and lower extremities- none noted on the palms or soles of the feet, none noted in the oral cavity  Psychiatric: She has a normal mood and affect. Her behavior is normal. Judgment and thought content normal.  Nursing note and vitals reviewed.    ED Treatments / Results  Labs (all labs ordered are listed, but only abnormal results are displayed) Labs Reviewed - No data to display  EKG  EKG Interpretation None       Radiology Ct Head Wo Contrast  Result Date: 09/01/2016 CLINICAL DATA:  46 year old  female with headache. EXAM: CT HEAD WITHOUT CONTRAST TECHNIQUE: Contiguous axial images were obtained from the base of the skull through the vertex without intravenous contrast. COMPARISON:  Head CT dated 05/31/2013 FINDINGS: Brain: No evidence of acute infarction, hemorrhage, hydrocephalus, extra-axial collection or mass lesion/mass effect. Vascular: No hyperdense vessel or unexpected calcification. Skull: Normal. Negative for fracture or focal lesion. Sinuses/Orbits: No acute finding. Other: None IMPRESSION: Unremarkable noncontrast CT of the brain. Electronically Signed   By: Anner Crete M.D.   On: 09/01/2016 02:34    Procedures Procedures (including critical care time)  Medications Ordered in ED Medications  acetaminophen (TYLENOL) tablet 650 mg (not administered)     Initial Impression / Assessment and Plan / ED Course  I have reviewed the triage vital signs and the nursing notes.  Pertinent labs & imaging results that were available during my care of the patient were reviewed by me and considered in my medical decision making (see chart for details).      Final Clinical Impressions(s) / ED Diagnoses   Final diagnoses:  Rash   Labs:   Imaging:  Consults:  Therapeutics:  Discharge Meds:   Assessment/Plan: 46 year old female presents today with complaints of rash. She's been seen numerous  times by various professions at different hospitals. No significant findings have been noted. Patient does have open sores, at this time I am unable to verify any specific parasite. Patient does not have any signs of cellulitis secondary to the source. She reports improvement with permethrin, she'll be started up or return again. Patient requesting head CT, which showed no significant findings. Patient without neurological deficits.  Patient will need outpatient follow-up with dermatology for ongoing management of her symptoms. Return precautions given, patient verbalized understanding and agreement to today's plan.      New Prescriptions New Prescriptions   LIDOCAINE (XYLOCAINE) 5 % OINTMENT    Apply 1 application topically as needed.   PERMETHRIN (ELIMITE) 5 % CREAM    Apply to affected area once     Okey Regal, PA-C 09/01/16 0411    Okey Regal, PA-C 09/01/16 0420    Ward, Delice Bison, DO 09/01/16 [phone]

## 2018-09-25 ENCOUNTER — Other Ambulatory Visit: Payer: Self-pay

## 2018-09-25 ENCOUNTER — Emergency Department (HOSPITAL_BASED_OUTPATIENT_CLINIC_OR_DEPARTMENT_OTHER): Payer: 59

## 2018-09-25 ENCOUNTER — Emergency Department (HOSPITAL_BASED_OUTPATIENT_CLINIC_OR_DEPARTMENT_OTHER)
Admission: EM | Admit: 2018-09-25 | Discharge: 2018-09-25 | Disposition: A | Discharge status: Home / Self Care | Payer: 59 | Attending: Emergency Medicine | Admitting: Emergency Medicine

## 2018-09-25 ENCOUNTER — Encounter (HOSPITAL_BASED_OUTPATIENT_CLINIC_OR_DEPARTMENT_OTHER): Payer: Self-pay | Admitting: *Deleted

## 2018-09-25 DIAGNOSIS — I10 Essential (primary) hypertension: Secondary | ICD-10-CM | POA: Diagnosis not present

## 2018-09-25 DIAGNOSIS — R519 Headache, unspecified: Secondary | ICD-10-CM

## 2018-09-25 DIAGNOSIS — H532 Diplopia: Secondary | ICD-10-CM | POA: Diagnosis not present

## 2018-09-25 DIAGNOSIS — R51 Headache: Secondary | ICD-10-CM | POA: Insufficient documentation

## 2018-09-25 DIAGNOSIS — Z79899 Other long term (current) drug therapy: Secondary | ICD-10-CM | POA: Diagnosis not present

## 2018-09-25 DIAGNOSIS — E039 Hypothyroidism, unspecified: Secondary | ICD-10-CM | POA: Insufficient documentation

## 2018-09-25 DIAGNOSIS — H538 Other visual disturbances: Secondary | ICD-10-CM | POA: Diagnosis not present

## 2018-09-25 LAB — BASIC METABOLIC PANEL
Anion gap: 12 (ref 5–15)
BUN: 17 mg/dL (ref 6–20)
CO2: 23 mmol/L (ref 22–32)
Calcium: 9.2 mg/dL (ref 8.9–10.3)
Chloride: 105 mmol/L (ref 98–111)
Creatinine, Ser: 0.56 mg/dL (ref 0.44–1.00)
GFR calc Af Amer: >60 mL/min (ref >60)
GFR calc non Af Amer: >60 mL/min (ref >60)
Glucose, Bld: 107 mg/dL — ABNORMAL HIGH (ref 70–99)
Potassium: 3.2 mmol/L — ABNORMAL LOW (ref 3.5–5.1)
Sodium: 140 mmol/L (ref 135–145)

## 2018-09-25 LAB — CBC WITH DIFFERENTIAL/PLATELET
Abs Immature Granulocytes: 0.01 10*3/uL (ref 0.00–0.07)
Basophils Absolute: 0.1 10*3/uL (ref 0.0–0.1)
Basophils Relative: 1 %
Eosinophils Absolute: 0.1 10*3/uL (ref 0.0–0.5)
Eosinophils Relative: 1 %
HCT: 40.2 % (ref 36.0–46.0)
Hemoglobin: 13.2 g/dL (ref 12.0–15.0)
Immature Granulocytes: 0 %
Lymphocytes Relative: 19 %
Lymphs Abs: 1.2 10*3/uL (ref 0.7–4.0)
MCH: 29.7 pg (ref 26.0–34.0)
MCHC: 32.8 g/dL (ref 30.0–36.0)
MCV: 90.5 fL (ref 80.0–100.0)
Monocytes Absolute: 0.7 10*3/uL (ref 0.1–1.0)
Monocytes Relative: 10 %
Neutro Abs: 4.5 10*3/uL (ref 1.7–7.7)
Neutrophils Relative %: 69 %
Platelets: 354 10*3/uL (ref 150–400)
RBC: 4.44 MIL/uL (ref 3.87–5.11)
RDW: 12 % (ref 11.5–15.5)
WBC: 6.5 10*3/uL (ref 4.0–10.5)
nRBC: 0 % (ref 0.0–0.2)

## 2018-09-25 LAB — TSH: TSH: 2.306 u[IU]/mL (ref 0.350–4.500)

## 2018-09-25 MED ORDER — POTASSIUM CHLORIDE CRYS ER 20 MEQ PO TBCR
20.0000 meq | EXTENDED_RELEASE_TABLET | Freq: Once | ORAL | Status: AC
  Administered 2018-09-25: 18:00:00 20 meq via ORAL
  Filled 2018-09-25: qty 1

## 2018-09-25 NOTE — Discharge Instructions (Addendum)
You were seen in the emergency department for 1 month of headaches and double vision.  You had blood work and a CAT scan that did not show any serious findings.  You were offered an MRI tonight as an emergent test but you would rather have it scheduled for Saturday.  Please keep your MRI appointment on Saturday.  If you have any worsening symptoms return to the emergency department.  We have also put a referral into neurology for follow-up.

## 2018-09-25 NOTE — ED Notes (Signed)
Patient transported to CT 
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
## 2018-09-25 NOTE — ED Triage Notes (Signed)
Pt c/o h/a and blurred vision x 3 weeks, sent here from PMD for eval

## 2018-09-27 ENCOUNTER — Ambulatory Visit (HOSPITAL_BASED_OUTPATIENT_CLINIC_OR_DEPARTMENT_OTHER)
Admission: RE | Admit: 2018-09-27 | Discharge: 2018-09-27 | Disposition: A | Discharge status: Home / Self Care | Payer: 59 | Source: Ambulatory Visit | Attending: Emergency Medicine | Admitting: Emergency Medicine

## 2018-09-27 ENCOUNTER — Other Ambulatory Visit: Payer: Self-pay

## 2018-09-27 DIAGNOSIS — H532 Diplopia: Secondary | ICD-10-CM | POA: Insufficient documentation

## 2018-09-27 DIAGNOSIS — R51 Headache: Secondary | ICD-10-CM | POA: Diagnosis not present

## 2018-09-27 MED ORDER — GADOPENTETATE DIMEGLUMINE 469.01 MG/ML IV SOLN
7.0000 mL | Freq: Once | INTRAVENOUS | Status: AC | PRN
  Administered 2018-09-27: 12:00:00 7 mL via INTRAVENOUS

## 2018-10-02 ENCOUNTER — Ambulatory Visit (INDEPENDENT_AMBULATORY_CARE_PROVIDER_SITE_OTHER): Payer: 59 | Admitting: Neurology

## 2018-10-02 ENCOUNTER — Other Ambulatory Visit: Payer: Self-pay

## 2018-10-02 ENCOUNTER — Encounter: Payer: Self-pay | Admitting: Neurology

## 2018-10-02 VITALS — BP 118/72 | HR 96 | Temp 97.1°F | Ht 64.0 in | Wt 150.0 lb

## 2018-10-02 DIAGNOSIS — F1190 Opioid use, unspecified, uncomplicated: Secondary | ICD-10-CM

## 2018-10-02 DIAGNOSIS — L959 Vasculitis limited to the skin, unspecified: Secondary | ICD-10-CM

## 2018-10-02 DIAGNOSIS — G4719 Other hypersomnia: Secondary | ICD-10-CM

## 2018-10-02 DIAGNOSIS — G4733 Obstructive sleep apnea (adult) (pediatric): Secondary | ICD-10-CM

## 2018-10-02 DIAGNOSIS — I776 Arteritis, unspecified: Secondary | ICD-10-CM | POA: Diagnosis not present

## 2018-10-02 DIAGNOSIS — F1120 Opioid dependence, uncomplicated: Secondary | ICD-10-CM

## 2018-10-02 DIAGNOSIS — H532 Diplopia: Secondary | ICD-10-CM | POA: Diagnosis not present

## 2018-10-02 DIAGNOSIS — F22 Delusional disorders: Secondary | ICD-10-CM

## 2018-10-02 NOTE — Patient Instructions (Signed)
Diplopia Diplopia is a condition in which a person sees two of a single object. It is also called double vision. There are two types of diplopia.  Monocular diplopia. This is double vision that affects only one eye. Monocular diplopia is often caused by a clouding of the lens in your eye (cataract) or by a problem in the way your eye focuses light.  Binocular diplopia. This is double vision that affects both eyes. However, when you shut one eye, the double vision will go away. Binocular diplopia may be more serious. It can be caused by: ? Problems with the nerves or muscles that are responsible for eye movement. ? Disease of the nerves (neurologic disease). ? Immune system conditions, such as Graves' disease. ? Migraine headaches. ? Tumors. ? An infection. ? A stroke. ? An injury. There are many causes of diplopia. Some are not dangerous and can be easily corrected. Diplopia may also be a symptom of a serious medical problem. You may need to see a health care provider who specializes in eye conditions (ophthalmologist) or a nerve specialist (neurologist) to find the cause. Follow these instructions at home:   Pay attention to any changes in your vision. Tell your health care provider about them.  Do not drive or operate heavy machinery if diplopia interferes with your vision.  Keep all follow-up visits as told by your health care provider. This is important. Contact a health care provider if:  Your diplopia gets worse.  You develop any other symptoms along with your diplopia, such as: ? Weakness. ? Numbness. ? Headache. ? Eye pain. ? Clumsiness. ? Nausea. ? Drooping eyelids. ? Abnormal movement of one eye. Get help right away if you:  Have sudden vision loss.  Suddenly get a very bad headache.  Have sudden weakness or numbness.  Suddenly lose the ability to speak, understand speech, or both. These symptoms may represent a serious problem that is an emergency. Do not wait  to see if the symptoms will go away. Get medical help right away. Call your local emergency services (911 in the [place] Do not drive yourself to the hospital. Summary  Diplopia is a condition in which a person sees two of a single object. It is also called double vision.  Monocular diplopia is double vision that affects only one eye. It is often caused by a clouding of the lens in your eye (cataract) or by a problem in the way your eye focuses light.  Binocular diplopia is double vision that affects both eyes. However, when you shut one eye, the double vision will go away. Binocular diplopia may be more serious.  If you have diplopia, you may need to see a health care provider who specializes in eye conditions (ophthalmologist) or a nerve specialist (neurologist) to find the cause. This information is not intended to replace advice given to you by your health care provider. Make sure you discuss any questions you have with your health care provider. Document Released: 11/17/2003 Document Revised: 01/09/2017 Document Reviewed: 01/09/2017 Elsevier Patient Education  2020 [employer].
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
## 2018-10-22 ENCOUNTER — Encounter: Payer: Self-pay | Admitting: Gynecology

## 2018-10-22 ENCOUNTER — Ambulatory Visit (INDEPENDENT_AMBULATORY_CARE_PROVIDER_SITE_OTHER): Payer: 59 | Admitting: Neurology

## 2018-10-22 DIAGNOSIS — H532 Diplopia: Secondary | ICD-10-CM

## 2018-10-22 DIAGNOSIS — G4733 Obstructive sleep apnea (adult) (pediatric): Secondary | ICD-10-CM

## 2018-10-22 DIAGNOSIS — I776 Arteritis, unspecified: Secondary | ICD-10-CM

## 2018-10-22 DIAGNOSIS — G4719 Other hypersomnia: Secondary | ICD-10-CM

## 2018-10-30 ENCOUNTER — Telehealth: Payer: Self-pay | Admitting: Neurology

## 2018-10-30 NOTE — Procedures (Signed)
Patient Information     First Name: Anne Last Name: Ward ID: [id number]  Birth Date: 03-04-1970 Age: 48 Gender: Female  Referring Provider: Ardith Dark, PA BMI: 25.6 (W=150 lb, H=5' 4'')  Neck Circ.:  14 '' Epworth:  16/24   Sleep Study Information    Study Date: Oct 23, 2018 S/H/A Version: 001.001.001.001 / 4.1.1528 / 2  History:    [name] was seen on 10-02-2018. She a 48 year old right-handed Caucasian female with known OSA ( diagnosed in 2013?), and currently untreated.   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    Summary & Diagnosis:     In a total sleep time of 7 hours and 44 minutes by HST there was no evidence of OSA or other sleep disordered breathing found. Mild snoring, no hypoxemia and no REM accentuation.   Recommendations:     Based on this HST result, there is no treatment for apnea needed, as apnea is not present.  Hypersomnia is unrelated to apnea. I do not treat fibromyalgia or chronic pain conditions. A follow up in the sleep clinic is as needed. I would not be able to support CPAP supplies or a new machine based on these data.   PS: Follow up only for other neurological work up if still needed, the patient is already treated  for vasculitis, diplopia.  Electronically Signed: Larey Seat, MD              Sleep Summary  Oxygen Saturation Statistics   Start Study Time: End Study Time: Total Recording Time:  1:41:38 AM    10:58:43 AM      9 h, 17 min  Total Sleep Time % REM of Sleep Time:  7 h, 44 min  23.9    Mean: 95 Minimum: 93 Maximum: 98  Mean of  Desaturations Nadirs (%):   93  Oxygen Desaturation. %: 4-9 10-20 >20 Total  Events Number Total  6 100.0  0 0.0  0 0.0  6 100.0  Oxygen Saturation: <90 <=88 <85 <80 <70  Duration (minutes): Sleep % 0.0 0.0 0.0 0.0 0.0 0.0 0.0 0.0 0.0 0.0     Respiratory Indices      Total Events REM NREM All Night  pRDI:  14  pAHI:  13 ODI:  6  pAHIc:  0  % CSR: 0.0 2.9 0.0 0.0 0.0 2.3 2.3 1.1 0.0 2.4 2.2 1.0 0.0       Pulse Rate Statistics during Sleep (BPM)      Mean: 70 Minimum: 39 Maximum:  89    Indices are calculated using technically valid sleep time of  5 hrs, 53 min. Central-Indices are calculated using technically valid sleep time of  5  hrs, 53 min. pRDI/pAHI are calculated using oxi desaturations ? 3%  Body Position Statistics  Position Supine Prone Right Left Non-Supine  Sleep (min) 416.5 0.0 48.1 0.0 48.1  Sleep % 89.7 0.0 10.3 0.0 10.3  pRDI 2.6 N/A 0.0 N/A 0.0  pAHI 2.4 N/A 0.0 N/A 0.0  ODI 1.1 N/A 0.0 N/A 0.0     Snoring Statistics Snoring Level (dB) >40 >50 >60 >70 >80 >Threshold (45)  Sleep (min) 46.8 1.4 0.6 0.0 0.0 2.4  Sleep % 10.1 0.3 0.1 0.0 0.0 0.5    Mean: 40 dB Sleep Stages Chart

## 2018-10-30 NOTE — Telephone Encounter (Signed)
-----   Message from Larey Seat, MD sent at 10/30/2018 12:55 PM EDT ----- Based on HST no need for CPAP intervention.   Cc PA Silver Huguenin

## 2018-10-30 NOTE — Telephone Encounter (Signed)
I can't see any indication on this study- is it possible the at she lost weight, or has less muscle relaxant or pain medication than 6-7 years ago?  That may affect apnea count as well.
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
## 2018-11-03 ENCOUNTER — Telehealth: Payer: Self-pay

## 2018-11-03 NOTE — Telephone Encounter (Signed)
Left message for patient to call back and schedule a repeat HST to verify results at no charge.

## 2018-11-03 NOTE — Telephone Encounter (Signed)
Shirlean Mylar has called to discuss repeating a study. If patient calls back Shirlean Mylar will get her scheduled.   "Left message for patient to call back and schedule a repeat HST to verify results at no charge. "

## 2019-03-18 ENCOUNTER — Emergency Department (HOSPITAL_COMMUNITY): Payer: 59

## 2019-03-18 ENCOUNTER — Encounter (HOSPITAL_COMMUNITY): Payer: Self-pay | Admitting: Emergency Medicine

## 2019-03-18 ENCOUNTER — Emergency Department (HOSPITAL_COMMUNITY)
Admission: EM | Admit: 2019-03-18 | Discharge: 2019-03-19 | Disposition: A | Discharge status: Home / Self Care | Payer: 59 | Attending: Emergency Medicine | Admitting: Emergency Medicine

## 2019-03-18 ENCOUNTER — Other Ambulatory Visit: Payer: Self-pay

## 2019-03-18 DIAGNOSIS — I10 Essential (primary) hypertension: Secondary | ICD-10-CM | POA: Insufficient documentation

## 2019-03-18 DIAGNOSIS — M542 Cervicalgia: Secondary | ICD-10-CM | POA: Insufficient documentation

## 2019-03-18 DIAGNOSIS — S62101A Fracture of unspecified carpal bone, right wrist, initial encounter for closed fracture: Secondary | ICD-10-CM

## 2019-03-18 DIAGNOSIS — Y999 Unspecified external cause status: Secondary | ICD-10-CM | POA: Diagnosis not present

## 2019-03-18 DIAGNOSIS — Y92410 Unspecified street and highway as the place of occurrence of the external cause: Secondary | ICD-10-CM | POA: Insufficient documentation

## 2019-03-18 DIAGNOSIS — Z79899 Other long term (current) drug therapy: Secondary | ICD-10-CM | POA: Insufficient documentation

## 2019-03-18 DIAGNOSIS — S2231XA Fracture of one rib, right side, initial encounter for closed fracture: Secondary | ICD-10-CM | POA: Insufficient documentation

## 2019-03-18 DIAGNOSIS — Y93I9 Activity, other involving external motion: Secondary | ICD-10-CM | POA: Diagnosis not present

## 2019-03-18 DIAGNOSIS — S6991XA Unspecified injury of right wrist, hand and finger(s), initial encounter: Secondary | ICD-10-CM | POA: Diagnosis present

## 2019-03-18 MED ORDER — HYDROCODONE-ACETAMINOPHEN 5-325 MG PO TABS
1.0000 | ORAL_TABLET | Freq: Once | ORAL | Status: AC
  Administered 2019-03-18: 1 via ORAL
  Filled 2019-03-18: qty 1

## 2019-03-18 MED ORDER — KETOROLAC TROMETHAMINE 15 MG/ML IJ SOLN
15.0000 mg | Freq: Once | INTRAMUSCULAR | Status: AC
  Administered 2019-03-18: 23:00:00 15 mg via INTRAVENOUS
  Filled 2019-03-18: qty 1
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
## 2019-03-18 NOTE — ED Notes (Signed)
Called Ortho to do splint

## 2019-03-19 MED ORDER — HYDROCODONE-ACETAMINOPHEN 5-325 MG PO TABS: 1.0000 | ORAL_TABLET | Freq: Four times a day (QID) | ORAL | 0 refills | Status: DC | PRN

## 2019-03-19 NOTE — Progress Notes (Signed)
Orthopedic Tech Progress Note Patient Details:  [name] 26-Feb-1970 [id number]  Ortho Devices Type of Ortho Device: Arm sling, Volar splint Ortho Device/Splint Location: lue Ortho Device/Splint Interventions: Ordered, Application, Adjustment   Post Interventions Patient Tolerated: Well Instructions Provided: Care of device, Adjustment of device   Karolee Stamps 03/19/2019, 12:54 AM
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
## 2019-03-31 ENCOUNTER — Encounter (HOSPITAL_COMMUNITY): Payer: Self-pay | Admitting: Emergency Medicine

## 2019-03-31 ENCOUNTER — Emergency Department (HOSPITAL_COMMUNITY)
Admission: EM | Admit: 2019-03-31 | Discharge: 2019-03-31 | Disposition: A | Discharge status: Home / Self Care | Payer: 59 | Attending: Emergency Medicine | Admitting: Emergency Medicine

## 2019-03-31 ENCOUNTER — Emergency Department (HOSPITAL_COMMUNITY): Payer: 59

## 2019-03-31 DIAGNOSIS — Y9389 Activity, other specified: Secondary | ICD-10-CM | POA: Insufficient documentation

## 2019-03-31 DIAGNOSIS — I10 Essential (primary) hypertension: Secondary | ICD-10-CM | POA: Diagnosis not present

## 2019-03-31 DIAGNOSIS — Y92410 Unspecified street and highway as the place of occurrence of the external cause: Secondary | ICD-10-CM | POA: Insufficient documentation

## 2019-03-31 DIAGNOSIS — S299XXA Unspecified injury of thorax, initial encounter: Secondary | ICD-10-CM | POA: Diagnosis present

## 2019-03-31 DIAGNOSIS — Y999 Unspecified external cause status: Secondary | ICD-10-CM | POA: Insufficient documentation

## 2019-03-31 DIAGNOSIS — Z79899 Other long term (current) drug therapy: Secondary | ICD-10-CM | POA: Diagnosis not present

## 2019-03-31 DIAGNOSIS — S2231XA Fracture of one rib, right side, initial encounter for closed fracture: Secondary | ICD-10-CM | POA: Diagnosis not present

## 2019-03-31 DIAGNOSIS — R112 Nausea with vomiting, unspecified: Secondary | ICD-10-CM | POA: Diagnosis not present

## 2019-03-31 LAB — CBC WITH DIFFERENTIAL/PLATELET
Abs Immature Granulocytes: 0.03 10*3/uL (ref 0.00–0.07)
Basophils Absolute: 0 10*3/uL (ref 0.0–0.1)
Basophils Relative: 0 %
Eosinophils Absolute: 0.1 10*3/uL (ref 0.0–0.5)
Eosinophils Relative: 1 %
HCT: 42.2 % (ref 36.0–46.0)
Hemoglobin: 14 g/dL (ref 12.0–15.0)
Immature Granulocytes: 0 %
Lymphocytes Relative: 11 %
Lymphs Abs: 1.1 10*3/uL (ref 0.7–4.0)
MCH: 30.5 pg (ref 26.0–34.0)
MCHC: 33.2 g/dL (ref 30.0–36.0)
MCV: 91.9 fL (ref 80.0–100.0)
Monocytes Absolute: 0.6 10*3/uL (ref 0.1–1.0)
Monocytes Relative: 6 %
Neutro Abs: 8.3 10*3/uL — ABNORMAL HIGH (ref 1.7–7.7)
Neutrophils Relative %: 82 %
Platelets: 378 10*3/uL (ref 150–400)
RBC: 4.59 MIL/uL (ref 3.87–5.11)
RDW: 11.9 % (ref 11.5–15.5)
WBC: 10.1 10*3/uL (ref 4.0–10.5)
nRBC: 0 % (ref 0.0–0.2)

## 2019-03-31 LAB — BASIC METABOLIC PANEL
Anion gap: 10 (ref 5–15)
BUN: 27 mg/dL — ABNORMAL HIGH (ref 6–20)
CO2: 25 mmol/L (ref 22–32)
Calcium: 9.4 mg/dL (ref 8.9–10.3)
Chloride: 99 mmol/L (ref 98–111)
Creatinine, Ser: 0.58 mg/dL (ref 0.44–1.00)
GFR calc Af Amer: >60 mL/min (ref >60)
GFR calc non Af Amer: >60 mL/min (ref >60)
Glucose, Bld: 114 mg/dL — ABNORMAL HIGH (ref 70–99)
Potassium: 4.2 mmol/L (ref 3.5–5.1)
Sodium: 134 mmol/L — ABNORMAL LOW (ref 135–145)

## 2019-03-31 MED ORDER — SODIUM CHLORIDE 0.9 % IV BOLUS (SEPSIS)
1000.0000 mL | Freq: Once | INTRAVENOUS | Status: AC
  Administered 2019-03-31: 1000 mL via INTRAVENOUS

## 2019-03-31 MED ORDER — ONDANSETRON HCL 4 MG/2ML IJ SOLN
4.0000 mg | Freq: Once | INTRAMUSCULAR | Status: AC
  Administered 2019-03-31: 4 mg via INTRAVENOUS
  Filled 2019-03-31: qty 2

## 2019-03-31 MED ORDER — KETOROLAC TROMETHAMINE 30 MG/ML IJ SOLN
30.0000 mg | Freq: Once | INTRAMUSCULAR | Status: AC
  Administered 2019-03-31: 30 mg via INTRAVENOUS
  Filled 2019-03-31: qty 1

## 2019-03-31 MED ORDER — ONDANSETRON 8 MG PO TBDP: 8.0000 mg | ORAL_TABLET | Freq: Three times a day (TID) | ORAL | 0 refills | Status: AC | PRN

## 2019-03-31 MED ORDER — FENTANYL CITRATE (PF) 100 MCG/2ML IJ SOLN
100.0000 ug | Freq: Once | INTRAMUSCULAR | Status: AC
  Administered 2019-03-31: 100 ug via INTRAVENOUS
  Filled 2019-03-31: qty 2
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
